# Patient Record
Sex: Male | Born: 1935 | Race: White | Hispanic: No | Marital: Married | Smoking: Former smoker
Health system: Southern US, Community
[De-identification: ages and names within clinical notes are randomized; demographics above are authoritative.]

## PROBLEM LIST (undated history)

## (undated) DIAGNOSIS — Z8679 Personal history of other diseases of the circulatory system: Secondary | ICD-10-CM

## (undated) DIAGNOSIS — R296 Repeated falls: Secondary | ICD-10-CM

## (undated) DIAGNOSIS — E782 Mixed hyperlipidemia: Secondary | ICD-10-CM

## (undated) DIAGNOSIS — C61 Malignant neoplasm of prostate: Secondary | ICD-10-CM

## (undated) DIAGNOSIS — I951 Orthostatic hypotension: Secondary | ICD-10-CM

## (undated) DIAGNOSIS — K219 Gastro-esophageal reflux disease without esophagitis: Secondary | ICD-10-CM

## (undated) DIAGNOSIS — I10 Essential (primary) hypertension: Secondary | ICD-10-CM

## (undated) HISTORY — PX: ABDOMINAL SURGERY: SHX537

## (undated) HISTORY — DX: Malignant neoplasm of prostate: C61

## (undated) HISTORY — DX: Repeated falls: R29.6

## (undated) HISTORY — PX: BACK SURGERY: SHX140

## (undated) HISTORY — DX: Personal history of other diseases of the circulatory system: Z86.79

## (undated) HISTORY — DX: Essential (primary) hypertension: I10

## (undated) HISTORY — PX: APPENDECTOMY: SHX54

---

## 1999-08-22 ENCOUNTER — Ambulatory Visit (HOSPITAL_COMMUNITY): Admission: RE | Admit: 1999-08-22 | Discharge: 1999-08-22 | Payer: Self-pay | Admitting: Gastroenterology

## 1999-08-22 ENCOUNTER — Encounter: Payer: Self-pay | Admitting: Gastroenterology

## 1999-10-26 ENCOUNTER — Ambulatory Visit (HOSPITAL_COMMUNITY): Admission: RE | Admit: 1999-10-26 | Discharge: 1999-10-26 | Payer: Self-pay | Admitting: Orthopedic Surgery

## 1999-10-26 ENCOUNTER — Encounter: Payer: Self-pay | Admitting: Orthopedic Surgery

## 1999-11-09 ENCOUNTER — Ambulatory Visit (HOSPITAL_COMMUNITY): Admission: RE | Admit: 1999-11-09 | Discharge: 1999-11-09 | Payer: Self-pay | Admitting: Orthopedic Surgery

## 1999-11-09 ENCOUNTER — Encounter: Payer: Self-pay | Admitting: Orthopedic Surgery

## 1999-11-23 ENCOUNTER — Encounter: Payer: Self-pay | Admitting: Orthopedic Surgery

## 1999-11-23 ENCOUNTER — Ambulatory Visit (HOSPITAL_COMMUNITY): Admission: RE | Admit: 1999-11-23 | Discharge: 1999-11-23 | Payer: Self-pay | Admitting: Orthopedic Surgery

## 1999-12-21 ENCOUNTER — Inpatient Hospital Stay (HOSPITAL_COMMUNITY): Admission: RE | Admit: 1999-12-21 | Discharge: 1999-12-22 | Payer: Self-pay | Admitting: Orthopedic Surgery

## 1999-12-21 ENCOUNTER — Encounter (INDEPENDENT_AMBULATORY_CARE_PROVIDER_SITE_OTHER): Payer: Self-pay | Admitting: Specialist

## 1999-12-21 ENCOUNTER — Encounter: Payer: Self-pay | Admitting: Orthopedic Surgery

## 2000-11-03 ENCOUNTER — Encounter: Payer: Self-pay | Admitting: *Deleted

## 2000-11-03 ENCOUNTER — Ambulatory Visit (HOSPITAL_COMMUNITY): Admission: RE | Admit: 2000-11-03 | Discharge: 2000-11-03 | Payer: Self-pay | Admitting: *Deleted

## 2002-06-24 ENCOUNTER — Encounter (INDEPENDENT_AMBULATORY_CARE_PROVIDER_SITE_OTHER): Payer: Self-pay

## 2002-06-24 ENCOUNTER — Ambulatory Visit (HOSPITAL_BASED_OUTPATIENT_CLINIC_OR_DEPARTMENT_OTHER): Admission: RE | Admit: 2002-06-24 | Discharge: 2002-06-24 | Payer: Self-pay | Admitting: Urology

## 2002-07-09 ENCOUNTER — Ambulatory Visit: Admission: RE | Admit: 2002-07-09 | Discharge: 2002-08-01 | Payer: Self-pay | Admitting: Radiation Oncology

## 2007-02-01 ENCOUNTER — Emergency Department (HOSPITAL_COMMUNITY): Admission: EM | Admit: 2007-02-01 | Discharge: 2007-02-01 | Payer: Self-pay | Admitting: Emergency Medicine

## 2008-02-05 ENCOUNTER — Inpatient Hospital Stay (HOSPITAL_COMMUNITY): Admission: EM | Admit: 2008-02-05 | Discharge: 2008-02-09 | Payer: Self-pay | Admitting: Emergency Medicine

## 2008-03-20 ENCOUNTER — Encounter: Admission: RE | Admit: 2008-03-20 | Discharge: 2008-04-16 | Payer: Self-pay | Admitting: Neurology

## 2008-04-20 ENCOUNTER — Encounter: Admission: RE | Admit: 2008-04-20 | Discharge: 2008-05-13 | Payer: Self-pay | Admitting: Neurology

## 2010-02-02 ENCOUNTER — Emergency Department (HOSPITAL_COMMUNITY)
Admission: EM | Admit: 2010-02-02 | Discharge: 2010-02-02 | Payer: Self-pay | Source: Home / Self Care | Admitting: Emergency Medicine

## 2010-06-29 LAB — BASIC METABOLIC PANEL
BUN: 22 mg/dL (ref 6–23)
CO2: 23 mEq/L (ref 19–32)
Calcium: 8.8 mg/dL (ref 8.4–10.5)
Chloride: 109 mEq/L (ref 96–112)
Creatinine, Ser: 0.79 mg/dL (ref 0.4–1.5)
GFR calc Af Amer: >60 mL/min (ref >60)
GFR calc non Af Amer: >60 mL/min (ref >60)
Glucose, Bld: 115 mg/dL — ABNORMAL HIGH (ref 70–99)
Potassium: 3.9 mEq/L (ref 3.5–5.1)
Sodium: 138 mEq/L (ref 135–145)

## 2010-06-29 LAB — DIFFERENTIAL
Basophils Absolute: 0 10*3/uL (ref 0.0–0.1)
Basophils Relative: 1 % (ref 0–1)
Eosinophils Absolute: 0.1 10*3/uL (ref 0.0–0.7)
Eosinophils Relative: 2 % (ref 0–5)
Lymphocytes Relative: 14 % (ref 12–46)
Lymphs Abs: 1 10*3/uL (ref 0.7–4.0)
Monocytes Absolute: 0.5 10*3/uL (ref 0.1–1.0)
Monocytes Relative: 6 % (ref 3–12)
Neutro Abs: 6.1 10*3/uL (ref 1.7–7.7)
Neutrophils Relative %: 78 % — ABNORMAL HIGH (ref 43–77)

## 2010-06-29 LAB — CBC
HCT: 37.6 % — ABNORMAL LOW (ref 39.0–52.0)
Hemoglobin: 12.8 g/dL — ABNORMAL LOW (ref 13.0–17.0)
MCH: 30.5 pg (ref 26.0–34.0)
MCHC: 34.1 g/dL (ref 30.0–36.0)
MCV: 89.3 fL (ref 78.0–100.0)
Platelets: 146 10*3/uL — ABNORMAL LOW (ref 150–400)
RBC: 4.21 MIL/uL — ABNORMAL LOW (ref 4.22–5.81)
RDW: 13.9 % (ref 11.5–15.5)
WBC: 7.8 10*3/uL (ref 4.0–10.5)

## 2010-08-30 NOTE — Discharge Summary (Signed)
NAME:  Jermaine Lee, Jermaine Lee NO.:  [id number]   MEDICAL RECORD NO.:  [id number]          PATIENT TYPE:  INP   LOCATION:  3004                         FACILITY:  [facility]   PHYSICIAN:  Marlan Palau, M.D.  DATE OF BIRTH:  13-May-1921   DATE OF ADMISSION:  02/05/2008  DATE OF DISCHARGE:  02/09/2008                               DISCHARGE SUMMARY   ADMISSION DIAGNOSIS:  1. History of gait instability with fall and confusion.  2. Parkinson disease.  3. Hypertension.   DISCHARGE DIAGNOSIS:  1. Parkinson disease.  2. Hypertension.  3. Confusion/encephalopathy.   PROCEDURES DURING THIS ADMISSION:  1. MRI of the brain.  2. MR angiogram of the intracranial vessels.   COMPLICATIONS TO ABOVE PROCEDURES:  None.   HISTORY OF PRESENT ILLNESS:  Jermaine Lee is an 77 year old right-handed  white male born on 1921-06-19 with a history of Parkinson  disease.  This patient has had recent fall prior to this admission with  some increasing confusion.  The patient has had some increased confusion  over the last several weeks with poor memory disorientation.  The  patient has been getting up and low at night, getting dressed as if to  go to work or to physical therapy.  The patient again has had several  falls.  The patient has had some confusion with taking his medications  as well and has had some problems with hallucinations prior to  admission.  The patient was brought into the hospital for further  management.   PAST MEDICAL HISTORY:  1. History of Parkinson disease.  2. Hypertension.  3. Lumbosacral spine surgery.  4. Cataract surgery.  5. Confusion and gait instability as above.   MEDICATIONS PRIOR TO ADMISSION:  1. Finasteride 5 mg daily.  2. Lotrel 5 mg daily.  3. Bromocriptine 5 mg 3 times daily.  4. Selegiline 5 mg daily.  5. Sinemet 25/100 three times daily.  6. Ethylene glycol if needed.   ALLERGIES:  The patient has history of allergies to SULFA  drugs.   HABITS:  Does not smoke or drink.   Please refer to history and physical dictation summary for social  history, family history, review of systems, and physical examination.   LABORATORY VALUES:  White count 4.6, hemoglobin 12.5, hematocrit 37.4,  MCV of 89.9, and platelets of 138.  INR of 1.1.  Sodium 138, potassium  3.9, chloride 109, CO2 23, glucose 109, BUN of 27, and creatinine 0.95.  TSH 1.7.  B12 level of 325.  Thiamine level of less than 7.  The patient  has a specific gravity of 1.025, pH of 5.5, and RPR nonreactive.   HOSPITAL COURSE:  This patient was admitted to [facility].  The  patient was maintained on his Parkinson's medications and was seen by  Physical and Occupational Therapy.  The patient did undergo an MRI of  the brain and had some motion artifact with this.  There was mild  subarachnoid hemorrhage in the right occipital area with a small  subdural hematoma and no acute stroke seen.  MRI of  the brain shows  motion-graded study.  There was some atherosclerosis in the distal right  vertebral artery and posterior cerebral artery and some atherosclerosis  in the anterior and middle cerebral arteries on both sides.  The patient  had an EKG with normal sinus rhythm, heart rate of 67.  The patient was  also seen by Dr. Bertram Savin.  Due to the recent fall and laceration,  the head was sutured.  The patient's mental status seemed to improve  significantly during hospitalization and decision was to send the  patient home.  The patient will follow up with Dr. Sandria Manly in 2-3 weeks and  follow with Dr. Jacky Kindle for suture removal.   DISCHARGE MEDICATIONS:  1. Finasteride 5 mg daily.  2. Lotrel 5 mg daily.  3. Bromocriptine 5 mg 3 times daily.  4. Selegiline 5 mg in the morning and one at lunchtime.  5. Sinemet 25/100 one 3 times daily.  6. Ethylene glycol if needed.  7. Amlodipine/benazepril 520 one daily.   FOLLOWUP:  The patient will again followup with  Dr. Jacky Kindle and Dr. Sandria Manly  as above.      Marlan Palau, M.D.  Electronically Signed     CKW/MEDQ  D:  05/11/2008  T:  05/11/2008  Job:  [id number]   cc:   [facility]

## 2010-08-30 NOTE — Procedures (Signed)
CLINICAL HISTORY:  The patient is an 77 year old who fell, lacerating  the left temporal region causing swelling to his eye.  The patient had  diffuse subarachnoid hemorrhage and a right parietooccipital small  subdural hematoma.  No intraparenchymal damage was seen.  (852.22,  852.02)   PROCEDURE:  The tracing was carried out on a 32-channel digital Cadwell  recorder reformatted into 16-channel montages with one devoted to EKG.  The patient was awake and drowsy during the recording.  The  international 10/20 system lead placement was used.  Medications include  Proscar, Seroquel, Sinemet, Parlodel, Eldepryl, Tylenol, and Ambien.  The patient has been unsteady, confused, dizzy, has had altered speech,  hallucinations prior to the fall.   DESCRIPTION OF FINDINGS:  Dominant frequency of 7 Hz, 20 microvolt  activity that is broadly distributed, this was present throughout most  of the record.  The patient does not change state of arousal.  There was  no focal slowing.  There was no interictal epileptiform activity form of  spikes or sharp waves.   EKG showed a regular sinus rhythm with ventricular response of 66 beats  per minute.   IMPRESSION:  Abnormal EEG on the basis of mild diffuse background  slowing.  This is a nonspecific indicator of neuronal dysfunction, it  maybe on a primary degenerative basis or secondary to a variety of toxic  or metabolic etiologies.      Deanna Artis. Sharene Skeans, M.D.  Electronically Signed     EAV:WUJW  D:  02/07/2008 00:05:39  T:  02/07/2008 05:01:51  Job #:  [id number]   cc:   Marlan Palau, M.D.  Fax: [phone]

## 2010-08-30 NOTE — H&P (Signed)
NAME:  Jermaine Lee, Jermaine Lee NO.:  [id number]   MEDICAL RECORD NO.:  [id number]          PATIENT TYPE:  EMS   LOCATION:  MAJO                         FACILITY:  [facility]   PHYSICIAN:  Marlan Palau, M.D.  DATE OF BIRTH:  11-09-1921   DATE OF ADMISSION:  02/05/2008  DATE OF DISCHARGE:                              HISTORY & PHYSICAL   CHIEF COMPLAINT:  Fall and confusion.   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  The patient was brought to [facility] ER on this  date secondary to a fall and increased confusion.  Speaking with the  wife, she states that the patient over the last few weeks has had an  increased number of falls, increased confusion, and lack of memory along  with disorientation.  She notes that today upon waking the patient had  put on 2 different shoes, put on slacks when he was to put on sweat  pants to go to physical therapy.  In addition, she has noted that  patient has gotten up in the past week to go the bathroom.  After  returning from the bathroom, got confused as to where he was and fell  next to the bed while trying to get into the bed.  She has also noted  that [place] this [place], the patient was  stepping backwards to sit down.  He misjudged the distance, lost his  balance and fell down.  Today the wife states that the patient woke up  confused, and as they got to the breakfast table his usual habit is to  take his medications for the day; however, she does not think he took  his medications today.  The patient was in full control of his  medications up to this date; however, it was explained to the wife that  at this point in time the best course of action is for her to start  separating the medications so as he does not get  confused or take too  many or too less.  While talking with his wife, it was noted that he has  had increased visual hallucinations, however, no auditory  hallucinations.  The patient is aware of these hallucinations and knows  they are not real.  These hallucinations have been going on for quite  some time but increased in the past couple of weeks.  Today patient was  noted by neighbor to drive into the driveway, get out of his car, and  walk inside; however, he left his flashing lights on.  His neighbor  walked over to explain to him that his lights were on and states that  when he had come to the door he looked very confused as though he had  gotten up.  She asked if he had just gotten up from sleep.  He stated  no.  In addition, she noted that  his gait was extremely shuffled and  stacatic.  The neighbor asked him to please get his keys to his car.  As  he turned around and she went to the car she heard a large thud and  noted him on the ground in the foyer of his house.  The patient did lose  consciousness for about 10 minutes and EMS was called.  Upon awakening  the patient was mumbling but no fluid speech.  His fall did cause, as  stated, a loss of consciousness and a large 3-cm horizontal laceration  to the parietal aspect of his head.  During interview the patient is  alert, however, he is not fully oriented.  At this time it cannot be  told for sure whether or not he has taken his full medications, or has  been taking the full dose and correct dose of medications.  It is  certain, according to the wife, that he has had increased number of  falls, decreased memory, increased confusion, and more hallucinations.   PAST MEDICAL HISTORY:  Significant for Parkinson's disease and  hypertension.   MEDICATIONS:  1. Finasteride 5 mg daily.  2. Lotrel 5 mg daily.  3. Bromocriptine 5 mg t.i.d.  4. Selegiline 5 mg q.a.m.  5. Carbidopa-Levodopa  25/100 t.i.d.  6. Ethylene glycol as  needed.   ALLERGIES:  SULFA?   FAMILY HISTORY:  Father is deceased, mother deceased at 70 years old  secondary to inhalation of chemicals.  Brother has prostate cancer.  Sister is healthy.  He is married and has 3 children which are all  normal.   SOCIAL HISTORY:  He does not smoke, drink or do illicit drugs.   REVIEW OF SYSTEMS:  All 12 review of systems were thoroughly reviewed  and negative with the exception of above.   PHYSICAL EXAMINATION:  VITAL SIGNS:  Blood pressure is 153/17, pulse 68,  respiratory rate 20, temperature is 97.2.  MENTAL STATUS:  He is alert, however, he does not know place, year.  He  had difficulty spelling world backwards.  He had difficulty with serial  7s.  CRANIAL NERVES:  Pupils are round, reactive to light and accommodating  conjugative gaze.  External ocular muscles are intact.  Visual field  intact.  It should be noted that the left pupil is 2 mm and slightly  elevated compared to the right pupil which is 3 mm.  Both, as stated,  are reactive to light.  Face is symmetrical.  Tongue midline.  Uvula  midline.  No dysarthria, aphasia, slurred speech.  Sensation in V1  through V3 are equal, shoulder shrug, hip turn are equal bilaterally.  Finger to nose is dysmetric with pursuit, however, he has full range of  motion.  Heel to shin, the patient was unable to do this secondary to  weakness.  His fine motor movement was slow.  Motor, he has 5/5 strength  in all extremities.  He does have a resting tremor with his hands that  is intermittent, but noticeable with hands held out and wrists extended.  He has no atrophy, he has good tone, but he does have cogwheeling,  rigidity with flexion extension of his biceps/triceps and passive  flexion extension of his legs.  Deep tendon reflexes are 2+ in upper  extremities, 3+ at the patella, 1+ at the Achilles bilaterally, and  bilateral downgoing toes.  Sensation is decreased over his right lower  extremity  starting just below the knee and extending to  the foot  compared to the left.  He also states that he cannot feel vibration at  his ankle.  Will have to review other physical exams to see if this is  new.  Otherwise, globally intact.   LABORATORY DATA:  Urinalysis pending.  Calcium is 8.8, sodium is 138,  potassium is 3.9, chloride is 109, CO2 is 23, BUN is 27, creatinine is  0.95, glucose is 109, white blood cells are 4.6, platelets 138,  hemoglobin and hematocrit is 12.5 and 37.4.   IMAGING AND TESTS:  CT of cervical spine shows moderate degenerative  disk disease but good alignment.   Chest x-ray shows no active disease or atelectasis.   MRI is pending and EEG is pending at this time.   ASSESSMENT:  This is an 77 year old male with known Parkinson's disease  and increasing dementia status post fall today.  History of increased  confusion, falling, and hallucinations over the past couple of weeks.  It is unknown if patient did take his regular doses of medications  today, and if he has taken the correct doses over the last few weeks as  wife states that he is in control of his medications; however, he has  been confused lately.   TREATMENT AND PLAN:  1. MRI of the head.  2. EEG.  3. Admit and monitor with correct medication doses, may need to alter      doses.  4. It has been discussed with the patient's wife that at this point in      time it would be beneficial for her to take control of the      medications so he does not take the wrong dose or forget a dose.     ______________________________  Felicie Morn, PA-C      C. Lesia Sago, M.D.  Electronically Signed    DS/MEDQ  D:  02/05/2008  T:  02/05/2008  Job:  [id number]

## 2010-08-30 NOTE — Consult Note (Signed)
NAME:  Jermaine Lee, Jermaine Lee NO.:  [id number]   MEDICAL RECORD NO.:  [id number]          PATIENT TYPE:  EMS   LOCATION:  MAJO                         FACILITY:  [facility]   PHYSICIAN:  Lennie Muckle, MD      DATE OF BIRTH:  1921-12-27   DATE OF CONSULTATION:  DATE OF DISCHARGE:                                 CONSULTATION   PRIMARY CARE PHYSICIAN:  Geoffry Paradise, M.D.   REASON FOR CONSULT:  Fall with confusion.   HISTORY OF PRESENT ILLNESS:  [name] is an 77 year old male with known  Parkinson disease who has witnessed a fall from standing height as  witnessed by neighbor and by his wife.  There was a question of loss of  consciousness with less than an hour.  The story was that the neighbor  had noted the patient's blinkers on his car, came to tell the patient,  when he went in to retrieve his keys, he fell.  The wife noted he had a  recent history of confusion, dizziness, and hallucinations at home.  He  had also had some altered speech and slurring of his speech prior to the  event.  He currently is awake and has a GCS of 13.  He does answer  questions appropriately and does not currently seem confused.   PAST MEDICAL HISTORY:  Remarkable for Parkinson disease.   SURGICAL HISTORY:  Had previous back surgery as well as cataract  surgery.   SOCIAL HISTORY:  No tobacco or alcohol use.  Married and lives with his  wife.  He is retired.   ALLERGIES:  PENICILLIN.   MEDICATIONS:  Multiple and unknown at the present time. We are currently  trying to achieve this list.   REVIEW OF SYSTEMS:  Normal except for the above as in the HPI with  mental status changes, confusion, and somewhat has a sluggish gait   PHYSICAL EXAMINATION:  VITAL SIGNS:  Temperature 97.2, pulse 68, BP  153/70, and O2 sat on room air 97%.  GENERAL:  He appears his stated age.  He is lying in his stretcher, does  open his eyes on command.  SKIN:  He has, on examination of his skin, multiple  abrasions to the  left arm.  He has a laceration noted, semicircle on the left temporal  region, noted to be approximately 5 cm in size, not bleeding.  HEENT:  Head is normocephalic.  There is some mild swelling around the  left frontal area with abrasions.  Eyes are equal and round.  Ears are  clear.  Midface is stable. No injuries are noted.  NECK:  Without tenderness.  PULMONARY:  Clear to auscultation bilaterally.  CARDIOVASCULAR:  Regular rate and rhythm.  ABDOMEN:  Soft, nontender, and nondistended.  PELVIS:  Stable.  MUSCULOSKELETAL:  No significant deformities.  There are multiple  abrasions on the left upper extremity.  BACK:  He has a scar on his lumbar region from previous surgery.  NEUROLOGICAL:  He does have a bilateral upper extremity pill-rolling  tremor.  The cranial nerves II through XII are  grossly intact.  They are  symmetric on examination of the upper and lower extremity.   LABORATORY DATA:  Slightly low with hemoglobin of 12, otherwise  essentially normal.  CT scan of the head and neck are negative for acute  findings.  C-spine is clear.   ASSESSMENT AND PLAN:  Status post fall with a laceration to the left  temporal region that should be closed by the Emergency Department.  He  does have a mental status changes prior to his fall.  It could be  potentially related to his Parkinson's or his medication.  He will be  admitted to the Neurology Service.  Trauma has been consulted.  We will  follow for the next 24 hours, but likely he would not require any  surgery or other intervention.      Lennie Muckle, MD  Electronically Signed     ALA/MEDQ  D:  02/05/2008  T:  02/06/2008  Job:  [id number]   cc:   Gabrielle Dare. Janee Morn, M.D.

## 2010-09-02 NOTE — Op Note (Signed)
NAME:  Jermaine Lee, Jermaine Lee                          ACCOUNT NO.:  [id number]   MEDICAL RECORD NO.:  [id number]                   PATIENT TYPE:  AMB   LOCATION:  NESC                                 [facility]ACILITY[facility]   PHYSICIAN:  Ronald L. Ovidio Hanger, M.D.           DATE OF BIRTH:  [birth year]   DATE OF PROCEDURE:  06/24/2002  DATE OF DISCHARGE:                                 OPERATIVE REPORT   PREOPERATIVE DIAGNOSES:  Gross hematuria, prostatic bleeding, liver cyst.   POSTOPERATIVE DIAGNOSES:  Gross hematuria, prostatic bleeding, liver cyst.   OPERATION:  Cystourethroscopy, hot loop biopsy prostatic urethra.  Transrectal ultrasound and biopsy of the prostate and liver ultrasound.   SURGEON:  Lucrezia Starch. Earlene Plater, M.D.   ANESTHESIA:  General laryngeal airway.   ESTIMATED BLOOD LOSS:  15 mL.   TUBES:  22 French 10 mL balloon catheter.   COMPLICATIONS:  None.   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 He subsequently underwent a workup which  consisted of a CT scan of the abdomen and pelvis with and without contrast  and there was subcentimeter cyst in the kidney, two small probable cystic  lesions of the liver which felt needed to be followed up with an ultrasound  and on cystourethroscopy he had significant prostate bleeding. We considered  all options carefully, his cultures have been negative and it was felt that  prostate biopsy both Cobb loop and needle biopsies were indicated. He  understands the risks, benefits, and alternatives and has elected to  proceed. He has also been begun on Proscar 5 mg p.o. daily and he has been  diagnosed with a melanoma on his back in the interim.   DESCRIPTION OF PROCEDURE:  The patient was placed in supine position and  after proper general laryngeal airway anesthesia was placed in the dorsal  lithotomy position, prepped and draped with Betadine in the sterile fashion.  The  urethral meatus was somewhat tight and was calibrated to 22 [place] and a  24 French Olympus resectoscope sheath was placed over a Timberlake obturator  and utilizing the camera and the hot loop, the bladder was visualized. With  the 12 and 70 degree lenses, the bladder was carefully inspected and was  noted to have grade 1 trabeculation. Efflux of clear urine was noted from  the normally placed ureteral orifices bilaterally. There was quite an  enlarged prostate with a large median bar and significant bleeding from the  mid prostatic urethra. Utilizing the hot loop and 12 degree lens, hot loop  biopsies were obtained. The area of the bladder neck extended to the mid  prostate in the 6 o'clock position and submitted to pathology. The base was  cauterized with Bugbee coagulation cautery. The resectoscope sheath was  visually removed and a 22 French 10 mL ballon Foley was passed and the urine  was noted to drain clear. Next, the Seaman's transrectal ultrasound probe  was placed and localized. Transrectal ultrasound was then performed with the  6 MHz probe, both axial and sagittal scans were performed. The prostate was  noted to be 116.7 mL utilizing volumetric measurement. There was significant  transitional cell hypertrophy, there were a few central __________  calcifications, the capsule was intact at the seminal vesicle and seminal  vesicle prostatic angles and there was slight heteroechogenicity but no hypo  or hyperechoic nodules were noted. Utilizing the biopsy probe, biopsies were  obtained, apex mid base laterally, both right and left sides and transition  zones with 4 biopsies in each side submitted as right and left sides. Good  hemostasis was noted to be present, the probe was removed. Attempt at liver  ultrasound was performed and although a definite cyst in the right lobe of  the liver, it was measured at 13.9 x 11.6 mm noted to be present. The left  lobe cystic lesions could not  be well visualized and needed a high powered  machine with better resolution. He tolerated the procedure well and was  taken to the recovery room stable.   PLAN:  1. Have him followup today in the office for a color flow Doppler of the     liver to make sure the lesions are not problematic especially in lieu of     having recently diagnosed melanoma.  2. He will maintain his catheter overnight, if the urine is clear he will     remove it in the morning. He will call me as needed and he will call me     on Friday for his biopsy results.                                               Ronald L. Ovidio Hanger, M.D.    RLD/MEDQ  D:  06/24/2002  T:  06/24/2002  Job:  [id number]

## 2011-01-17 LAB — DIFFERENTIAL
Basophils Absolute: 0
Basophils Relative: 1
Eosinophils Absolute: 0
Eosinophils Relative: 1
Lymphocytes Relative: 24
Lymphs Abs: 1.1
Monocytes Absolute: 0.4
Monocytes Relative: 8
Neutro Abs: 3
Neutrophils Relative %: 66

## 2011-01-17 LAB — BASIC METABOLIC PANEL
BUN: 18
BUN: 27 — ABNORMAL HIGH
CO2: 23
CO2: 23
Calcium: 8.8
Calcium: 8.8
Chloride: 109
Chloride: 110
Creatinine, Ser: 0.78
Creatinine, Ser: 0.95
GFR calc Af Amer: >60
GFR calc Af Amer: >60
GFR calc non Af Amer: >60
GFR calc non Af Amer: >60
Glucose, Bld: 109 — ABNORMAL HIGH
Glucose, Bld: 98
Potassium: 3.9
Potassium: 3.9
Sodium: 138
Sodium: 140

## 2011-01-17 LAB — CBC
HCT: 37.4 — ABNORMAL LOW
Hemoglobin: 12.5 — ABNORMAL LOW
MCHC: 33.4
MCV: 89.9
Platelets: 138 — ABNORMAL LOW
RBC: 4.16 — ABNORMAL LOW
RDW: 13.3
WBC: 4.6

## 2011-01-17 LAB — PROTIME-INR
INR: 1.1
Prothrombin Time: 14.3

## 2011-01-17 LAB — URINALYSIS, ROUTINE W REFLEX MICROSCOPIC
Bilirubin Urine: NEGATIVE
Glucose, UA: NEGATIVE
Hgb urine dipstick: NEGATIVE
Ketones, ur: 40 — AB
Nitrite: NEGATIVE
Protein, ur: NEGATIVE
Specific Gravity, Urine: 1.025
Urobilinogen, UA: 1
pH: 5.5

## 2011-01-17 LAB — URINE CULTURE
Colony Count: NO GROWTH
Culture: NO GROWTH

## 2011-01-17 LAB — CULTURE, BLOOD (ROUTINE X 2): Culture: NO GROWTH

## 2011-01-17 LAB — VITAMIN B1: Vitamin B1 (Thiamine): <7 nmol/L — ABNORMAL LOW (ref 9–44)

## 2011-01-17 LAB — TSH: TSH: 1.766

## 2011-01-17 LAB — VITAMIN B12: Vitamin B-12: 325 (ref 211–911)

## 2011-01-17 LAB — RPR: RPR Ser Ql: NONREACTIVE

## 2011-03-28 ENCOUNTER — Ambulatory Visit: Payer: Medicare Other

## 2011-03-28 ENCOUNTER — Ambulatory Visit: Payer: Medicare Other | Attending: Neurology | Admitting: Physical Therapy

## 2011-03-28 DIAGNOSIS — R279 Unspecified lack of coordination: Secondary | ICD-10-CM | POA: Insufficient documentation

## 2011-03-28 DIAGNOSIS — Z5189 Encounter for other specified aftercare: Secondary | ICD-10-CM | POA: Insufficient documentation

## 2011-03-28 DIAGNOSIS — G20A1 Parkinson's disease without dyskinesia, without mention of fluctuations: Secondary | ICD-10-CM | POA: Insufficient documentation

## 2011-03-28 DIAGNOSIS — R471 Dysarthria and anarthria: Secondary | ICD-10-CM | POA: Insufficient documentation

## 2011-03-28 DIAGNOSIS — G20 Parkinson's disease: Secondary | ICD-10-CM | POA: Insufficient documentation

## 2011-03-29 ENCOUNTER — Ambulatory Visit: Payer: Medicare Other | Admitting: Physical Therapy

## 2011-03-30 ENCOUNTER — Other Ambulatory Visit: Payer: Self-pay | Admitting: Internal Medicine

## 2011-03-31 ENCOUNTER — Ambulatory Visit
Admission: RE | Admit: 2011-03-31 | Discharge: 2011-03-31 | Disposition: A | Discharge status: Home / Self Care | Payer: Medicare Other | Source: Ambulatory Visit | Attending: Internal Medicine | Admitting: Internal Medicine

## 2011-03-31 MED ORDER — IOHEXOL 300 MG/ML  SOLN
100.0000 mL | Freq: Once | INTRAMUSCULAR | Status: AC | PRN
  Administered 2011-03-31: 100 mL via INTRAVENOUS

## 2011-04-04 ENCOUNTER — Ambulatory Visit: Payer: Medicare Other | Admitting: Physical Therapy

## 2011-04-13 ENCOUNTER — Ambulatory Visit: Payer: Medicare Other

## 2011-04-25 ENCOUNTER — Ambulatory Visit: Payer: Medicare Other | Attending: Neurology | Admitting: Physical Therapy

## 2011-04-25 ENCOUNTER — Ambulatory Visit: Payer: Medicare Other

## 2011-04-25 DIAGNOSIS — IMO0001 Reserved for inherently not codable concepts without codable children: Secondary | ICD-10-CM | POA: Insufficient documentation

## 2011-04-25 DIAGNOSIS — G20A1 Parkinson's disease without dyskinesia, without mention of fluctuations: Secondary | ICD-10-CM | POA: Insufficient documentation

## 2011-04-25 DIAGNOSIS — R279 Unspecified lack of coordination: Secondary | ICD-10-CM | POA: Insufficient documentation

## 2011-04-25 DIAGNOSIS — G20 Parkinson's disease: Secondary | ICD-10-CM | POA: Insufficient documentation

## 2011-04-25 DIAGNOSIS — R471 Dysarthria and anarthria: Secondary | ICD-10-CM | POA: Insufficient documentation

## 2011-04-27 ENCOUNTER — Ambulatory Visit: Payer: Medicare Other | Admitting: Physical Therapy

## 2011-04-27 ENCOUNTER — Ambulatory Visit: Payer: Medicare Other

## 2011-04-28 ENCOUNTER — Ambulatory Visit (INDEPENDENT_AMBULATORY_CARE_PROVIDER_SITE_OTHER): Payer: Medicare Other

## 2011-04-28 DIAGNOSIS — I10 Essential (primary) hypertension: Secondary | ICD-10-CM

## 2011-05-02 ENCOUNTER — Ambulatory Visit: Payer: Medicare Other

## 2011-05-02 ENCOUNTER — Ambulatory Visit: Payer: Medicare Other | Admitting: Physical Therapy

## 2011-05-04 ENCOUNTER — Ambulatory Visit: Payer: Medicare Other | Admitting: Physical Therapy

## 2011-05-04 ENCOUNTER — Ambulatory Visit: Payer: Medicare Other

## 2011-05-09 ENCOUNTER — Ambulatory Visit: Payer: Medicare Other

## 2011-05-09 ENCOUNTER — Ambulatory Visit: Payer: Medicare Other | Admitting: Physical Therapy

## 2011-05-12 ENCOUNTER — Ambulatory Visit: Payer: Medicare Other

## 2011-05-12 ENCOUNTER — Ambulatory Visit: Payer: Medicare Other | Admitting: Physical Therapy

## 2011-05-23 ENCOUNTER — Ambulatory Visit: Payer: Medicare Other

## 2011-05-23 ENCOUNTER — Ambulatory Visit: Payer: Medicare Other | Attending: Neurology | Admitting: Physical Therapy

## 2011-05-23 DIAGNOSIS — G20 Parkinson's disease: Secondary | ICD-10-CM | POA: Insufficient documentation

## 2011-05-23 DIAGNOSIS — G20A1 Parkinson's disease without dyskinesia, without mention of fluctuations: Secondary | ICD-10-CM | POA: Insufficient documentation

## 2011-05-23 DIAGNOSIS — R471 Dysarthria and anarthria: Secondary | ICD-10-CM | POA: Insufficient documentation

## 2011-05-23 DIAGNOSIS — Z5189 Encounter for other specified aftercare: Secondary | ICD-10-CM | POA: Insufficient documentation

## 2011-05-23 DIAGNOSIS — R279 Unspecified lack of coordination: Secondary | ICD-10-CM | POA: Insufficient documentation

## 2011-05-25 ENCOUNTER — Ambulatory Visit: Payer: Medicare Other | Admitting: Physical Therapy

## 2011-05-30 ENCOUNTER — Ambulatory Visit: Payer: Medicare Other | Admitting: Physical Therapy

## 2011-05-31 ENCOUNTER — Ambulatory Visit: Payer: Medicare Other | Admitting: Physical Therapy

## 2011-05-31 ENCOUNTER — Ambulatory Visit: Payer: Medicare Other

## 2011-06-01 ENCOUNTER — Ambulatory Visit: Payer: Medicare Other | Admitting: Physical Therapy

## 2011-06-06 ENCOUNTER — Ambulatory Visit: Payer: Medicare Other | Admitting: Physical Therapy

## 2011-06-08 ENCOUNTER — Ambulatory Visit: Payer: Medicare Other | Admitting: Physical Therapy

## 2011-06-13 ENCOUNTER — Ambulatory Visit: Payer: Medicare Other | Admitting: Physical Therapy

## 2011-06-14 ENCOUNTER — Ambulatory Visit: Payer: Medicare Other | Admitting: Physical Therapy

## 2011-06-15 ENCOUNTER — Ambulatory Visit: Payer: Medicare Other | Admitting: Physical Therapy

## 2011-06-16 ENCOUNTER — Ambulatory Visit: Payer: Medicare Other | Admitting: Physical Therapy

## 2011-07-04 ENCOUNTER — Other Ambulatory Visit: Payer: Self-pay | Admitting: Dermatology

## 2011-08-29 ENCOUNTER — Emergency Department (HOSPITAL_COMMUNITY): Payer: Medicare Other

## 2011-08-29 ENCOUNTER — Emergency Department (HOSPITAL_COMMUNITY)
Admission: EM | Admit: 2011-08-29 | Discharge: 2011-08-29 | Disposition: A | Discharge status: Home / Self Care | Payer: Medicare Other | Attending: Emergency Medicine | Admitting: Emergency Medicine

## 2011-08-29 ENCOUNTER — Encounter (HOSPITAL_COMMUNITY): Payer: Self-pay | Admitting: Emergency Medicine

## 2011-08-29 DIAGNOSIS — S0003XA Contusion of scalp, initial encounter: Secondary | ICD-10-CM | POA: Insufficient documentation

## 2011-08-29 DIAGNOSIS — S40019A Contusion of unspecified shoulder, initial encounter: Secondary | ICD-10-CM | POA: Insufficient documentation

## 2011-08-29 DIAGNOSIS — M25569 Pain in unspecified knee: Secondary | ICD-10-CM | POA: Insufficient documentation

## 2011-08-29 DIAGNOSIS — M25529 Pain in unspecified elbow: Secondary | ICD-10-CM | POA: Insufficient documentation

## 2011-08-29 DIAGNOSIS — S6990XA Unspecified injury of unspecified wrist, hand and finger(s), initial encounter: Secondary | ICD-10-CM | POA: Insufficient documentation

## 2011-08-29 DIAGNOSIS — S4980XA Other specified injuries of shoulder and upper arm, unspecified arm, initial encounter: Secondary | ICD-10-CM | POA: Insufficient documentation

## 2011-08-29 DIAGNOSIS — S51009A Unspecified open wound of unspecified elbow, initial encounter: Secondary | ICD-10-CM | POA: Insufficient documentation

## 2011-08-29 DIAGNOSIS — S59909A Unspecified injury of unspecified elbow, initial encounter: Secondary | ICD-10-CM | POA: Insufficient documentation

## 2011-08-29 DIAGNOSIS — W1809XA Striking against other object with subsequent fall, initial encounter: Secondary | ICD-10-CM | POA: Insufficient documentation

## 2011-08-29 DIAGNOSIS — M542 Cervicalgia: Secondary | ICD-10-CM | POA: Insufficient documentation

## 2011-08-29 DIAGNOSIS — G20 Parkinson's disease: Secondary | ICD-10-CM | POA: Insufficient documentation

## 2011-08-29 DIAGNOSIS — G20A1 Parkinson's disease without dyskinesia, without mention of fluctuations: Secondary | ICD-10-CM | POA: Insufficient documentation

## 2011-08-29 DIAGNOSIS — I10 Essential (primary) hypertension: Secondary | ICD-10-CM | POA: Insufficient documentation

## 2011-08-29 DIAGNOSIS — S0083XA Contusion of other part of head, initial encounter: Secondary | ICD-10-CM

## 2011-08-29 DIAGNOSIS — S51019A Laceration without foreign body of unspecified elbow, initial encounter: Secondary | ICD-10-CM

## 2011-08-29 DIAGNOSIS — W19XXXA Unspecified fall, initial encounter: Secondary | ICD-10-CM

## 2011-08-29 DIAGNOSIS — M25519 Pain in unspecified shoulder: Secondary | ICD-10-CM | POA: Insufficient documentation

## 2011-08-29 DIAGNOSIS — S46909A Unspecified injury of unspecified muscle, fascia and tendon at shoulder and upper arm level, unspecified arm, initial encounter: Secondary | ICD-10-CM | POA: Insufficient documentation

## 2011-08-29 DIAGNOSIS — Z79899 Other long term (current) drug therapy: Secondary | ICD-10-CM | POA: Insufficient documentation

## 2011-08-29 NOTE — Discharge Instructions (Signed)
Return here as needed, for any worsening in your condition.  Followup with her primary care Dr. for recheck.  Keep the area of the elbow covered with a bandage.

## 2011-08-29 NOTE — ED Notes (Signed)
EMS reports that patient fell coming out of bathroom. Patient complain to right side of neck, right shoulder and right elbow. Patient also has a skin tear to right elbow and an a abrasion to right side of forehead.

## 2011-08-29 NOTE — ED Notes (Signed)
Patient discharge with family with written and verbal instructions in a wheelchair. Respirations equal and unlabored, skin warm and Dry. No acute distress noted.

## 2011-08-29 NOTE — ED Provider Notes (Signed)
History     CSN: [id number]  Arrival date & time 08/29/11  1916   First MD Initiated Contact with Patient 08/29/11 2004      Chief Complaint  Patient presents with  . Fall  . Shoulder Injury  . Elbow Injury    (Consider location/radiation/quality/duration/timing/severity/associated sxs/prior treatment) Patient is a 78 y.o. male presenting with fall and shoulder injury.  Fall  Shoulder Injury   Patient presents to the emergency department following a fall just prior to arrival.  Patient states that he feels like it is deep to his Parkinson's and his feet got caught and he fell forward or to the right hitting against the wall and falling to the floor.  Patient denies loss of consciousness, headache, syncope, blurred vision, dizziness, chest pain, shortness of breath, back pain, hip pain, or abdominal pain.  Patient, states he does have some mild pain in his neck on the right, his left elbow and right shoulder. The patient states that his R knee is mildly tender as well. The patient states that nothing seems to make his pain any worse. The patient has a skin tear on the R elbow. Past Medical History  Diagnosis Date  . Parkinson's disease   . Hypertension     Past Surgical History  Procedure Date  . Abdominal surgery   . Back surgery   . Appendectomy     No family history on file.  History  Substance Use Topics  . Smoking status: Former Games developer  . Smokeless tobacco: Never Used  . Alcohol Use: 1.2 oz/week    1 Glasses of wine, 1 Cans of beer per week      Review of Systems All other systems negative except as documented in the HPI. All pertinent positives and negatives as reviewed in the HPI.  Allergies  Penicillins and Sulfa antibiotics  Home Medications   Current Outpatient Rx  Name Route Sig Dispense Refill  . BENAZEPRIL HCL 20 MG PO TABS Oral Take 20 mg by mouth daily.    Marland Kitchen CARBIDOPA-LEVODOPA 25-100 MG PO TABS Oral Take 0.5-1 tablets by mouth See admin  instructions. Take 1 tablet in the am, 1 at lunch and 0.5 tablet in the evening.    Marland Kitchen FINASTERIDE 5 MG PO TABS Oral Take 5 mg by mouth daily.      BP 195/62  Pulse 62  Temp(Src) 98.6 F (37 C) (Oral)  Resp 20  SpO2 98%  Physical Exam  Constitutional: He is oriented to person, place, and time. He appears well-developed and well-nourished. No distress.  HENT:  Head: Normocephalic and atraumatic.  Eyes: EOM are normal. Pupils are equal, round, and reactive to light.  Neck: Normal range of motion. Neck supple.  Cardiovascular: Normal rate, regular rhythm and normal heart sounds.   Pulmonary/Chest: Effort normal and breath sounds normal.  Musculoskeletal:       Right elbow: He exhibits normal range of motion, no swelling, no effusion and no deformity.       Thoracic back: He exhibits normal range of motion, no tenderness, no deformity and no pain.       Lumbar back: He exhibits no tenderness, no bony tenderness, no swelling and no deformity.       Back:       Arms:      Legs: Neurological: He is alert and oriented to person, place, and time. He has normal strength. No sensory deficit. GCS eye subscore is 4. GCS verbal subscore is 5. GCS motor  subscore is 6.  Skin: Skin is warm and dry.    ED Course  Procedures (including critical care time)  Labs Reviewed - No data to display Dg Shoulder Right  08/29/2011  *RADIOLOGY REPORT*  Clinical Data: Fall.  Shoulder injury and pain.  RIGHT SHOULDER - 2+ VIEW  Comparison: None.  Findings: No evidence of fracture or dislocation.  Moderate degenerative changes are seen involving the acromioclavicular joint. Mild spur formation is seen along the undersurface of the acromion.  Mild degenerative spurring of the glenohumeral joint also noted.  No other significant bone abnormality identified. Soft tissues are unremarkable.  IMPRESSION:  1.  No acute findings. 2.  Moderate acromioclavicular degenerative changes, and spurring of the undersurface of the  acromion. 3.  Mild glenohumeral degenerative spurring.  Original Report Authenticated By: Danae Orleans, M.D.   Dg Elbow Complete Right  08/29/2011  *RADIOLOGY REPORT*  Clinical Data: Right elbow pain and laceration after fall.  RIGHT ELBOW - COMPLETE 3+ VIEW  Comparison: None.  Findings: Mild degenerative changes in the right elbow.  Right elbow appears otherwise intact.  No evidence of acute fracture or subluxation.  No focal bone lesion or bone destruction.  No significant effusion.  Subcutaneous gas in the olecranon region likely represents laceration.  IMPRESSION: No acute bony abnormalities.  Original Report Authenticated By: Marlon Pel, M.D.   Ct Head Wo Contrast  08/29/2011  *RADIOLOGY REPORT*  Clinical Data:  Fall right neck and shoulder pain.  Right forehead injury.  CT HEAD WITHOUT CONTRAST CT CERVICAL SPINE WITHOUT CONTRAST  Technique:  Multidetector CT imaging of the head and cervical spine was performed following the standard protocol without intravenous contrast.  Multiplanar CT image reconstructions of the cervical spine were also generated.  Comparison:  02/02/2010  CT HEAD  Findings: The brain stem, cerebellum, cerebral peduncles, thalami, basal ganglia, basilar cisterns, and ventricular system appear unremarkable.  No intracranial hemorrhage, mass lesion, or acute infarction is identified.  Mild chronic right maxillary sinusitis is present.  IMPRESSION:  1.  Mild chronic right maxillary sinusitis.  No acute intracranial findings.  CT CERVICAL SPINE  Findings: Mildly exaggerated cervical lordosis noted.  There is facet fusion on the right at C7-T1 which appears chronic.  Uncinate spurring causes mild right foraminal stenosis at C6-7.  There is 1 mm degenerative anterior subluxation of C7 on T1.  No acute subluxation or fracture is observed.  IMPRESSION:  1.  No cervical spine fracture or acute subluxation. 2.  Mild right foraminal stenosis at C6-7 due to uncinate spurring. 3.  Mildly  exaggerated cervical lordosis. 4.  Chronic facet fusion on the right at C7-T1.  Original Report Authenticated By: Dellia Cloud, M.D.   Ct Cervical Spine Wo Contrast  08/29/2011  *RADIOLOGY REPORT*  Clinical Data:  Fall right neck and shoulder pain.  Right forehead injury.  CT HEAD WITHOUT CONTRAST CT CERVICAL SPINE WITHOUT CONTRAST  Technique:  Multidetector CT imaging of the head and cervical spine was performed following the standard protocol without intravenous contrast.  Multiplanar CT image reconstructions of the cervical spine were also generated.  Comparison:  02/02/2010  CT HEAD  Findings: The brain stem, cerebellum, cerebral peduncles, thalami, basal ganglia, basilar cisterns, and ventricular system appear unremarkable.  No intracranial hemorrhage, mass lesion, or acute infarction is identified.  Mild chronic right maxillary sinusitis is present.  IMPRESSION:  1.  Mild chronic right maxillary sinusitis.  No acute intracranial findings.  CT CERVICAL SPINE  Findings: Mildly  exaggerated cervical lordosis noted.  There is facet fusion on the right at C7-T1 which appears chronic.  Uncinate spurring causes mild right foraminal stenosis at C6-7.  There is 1 mm degenerative anterior subluxation of C7 on T1.  No acute subluxation or fracture is observed.  IMPRESSION:  1.  No cervical spine fracture or acute subluxation. 2.  Mild right foraminal stenosis at C6-7 due to uncinate spurring. 3.  Mildly exaggerated cervical lordosis. 4.  Chronic facet fusion on the right at C7-T1.  Original Report Authenticated By: Dellia Cloud, M.D.   Dg Knee Complete 4 Views Right  08/29/2011  *RADIOLOGY REPORT*  Clinical Data: Fall.  Right knee injury and pain.  RIGHT KNEE - COMPLETE 4+ VIEW  Comparison: None.  Findings: No evidence of fracture or dislocation.  No evidence of knee arthropathy or knee joint effusion.  No other significant bone abnormality identified.  Peripheral vascular calcification noted.   IMPRESSION: No acute findings.  Original Report Authenticated By: Danae Orleans, M.D.     Patient had a skin tear total right elbow that was tacked down with Dermabond.  No suturing was needed because of the thin skin of the skin tear and with his age the skin would not hold sutures.  Patient is advised to return here for any worsening in his condition or x-rays.  Results were given and all questions were answered for the family and the patient.    MDM  MDM Reviewed: vitals and nursing note Interpretation: x-ray and CT scan            Carlyle Dolly, PA-C 08/29/11 2225

## 2011-08-29 NOTE — ED Provider Notes (Signed)
Medical screening examination/treatment/procedure(s) were conducted as a shared visit with non-physician practitioner(s) and myself.  I personally evaluated the patient during the encounter On my exam the patient was in no distress, resting comfortably.  The patient's radiographic studies were reassuring, and the patient's right elbow skin tear was dressed appropriately.  He was discharged in stable condition with his family members.  Gerhard Munch, MD 08/29/11 [phone]

## 2011-10-11 ENCOUNTER — Encounter (HOSPITAL_COMMUNITY): Payer: Self-pay | Admitting: *Deleted

## 2011-10-11 ENCOUNTER — Emergency Department (HOSPITAL_COMMUNITY): Payer: Medicare Other

## 2011-10-11 ENCOUNTER — Emergency Department (HOSPITAL_COMMUNITY)
Admission: EM | Admit: 2011-10-11 | Discharge: 2011-10-11 | Disposition: A | Discharge status: Home / Self Care | Payer: Medicare Other | Attending: Emergency Medicine | Admitting: Emergency Medicine

## 2011-10-11 DIAGNOSIS — M25539 Pain in unspecified wrist: Secondary | ICD-10-CM | POA: Insufficient documentation

## 2011-10-11 DIAGNOSIS — G20A1 Parkinson's disease without dyskinesia, without mention of fluctuations: Secondary | ICD-10-CM | POA: Insufficient documentation

## 2011-10-11 DIAGNOSIS — G20 Parkinson's disease: Secondary | ICD-10-CM | POA: Insufficient documentation

## 2011-10-11 DIAGNOSIS — R079 Chest pain, unspecified: Secondary | ICD-10-CM | POA: Insufficient documentation

## 2011-10-11 DIAGNOSIS — S0100XA Unspecified open wound of scalp, initial encounter: Secondary | ICD-10-CM | POA: Insufficient documentation

## 2011-10-11 DIAGNOSIS — W010XXA Fall on same level from slipping, tripping and stumbling without subsequent striking against object, initial encounter: Secondary | ICD-10-CM | POA: Insufficient documentation

## 2011-10-11 DIAGNOSIS — I10 Essential (primary) hypertension: Secondary | ICD-10-CM | POA: Insufficient documentation

## 2011-10-11 DIAGNOSIS — S0101XA Laceration without foreign body of scalp, initial encounter: Secondary | ICD-10-CM

## 2011-10-11 MED ORDER — BACITRACIN ZINC 500 UNIT/GM EX OINT
TOPICAL_OINTMENT | CUTANEOUS | Status: AC
  Administered 2011-10-11: 10:00:00
  Filled 2011-10-11: qty 0.9

## 2011-10-11 MED ORDER — LIDOCAINE-EPINEPHRINE (PF) 1 %-1:200000 IJ SOLN
INTRAMUSCULAR | Status: AC
  Administered 2011-10-11: 10:00:00
  Filled 2011-10-11: qty 10

## 2011-10-11 NOTE — ED Provider Notes (Signed)
History     CSN: [id number]  Arrival date & time 10/11/11  4098   First MD Initiated Contact with Patient 10/11/11 0820      Chief Complaint  Patient presents with  . Fall    (Consider location/radiation/quality/duration/timing/severity/associated sxs/prior treatment) HPI Patient fell this a.m. In kitchen.  States toes caught in hem of pants and fell to floor striking head and right forearm.  Patient and wife unable to get him up from floor.  EMS came and assisted up.  Patient ambulated to car and presents to ed via private vehicle.  Patient denies loc, headache, neck pain, sob,or other injury.  Patient states in usual state of health except Parkinson's.  Patient ambulating without cane or walker this a.m.  Patient has both.   Past Medical History  Diagnosis Date  . Parkinson's disease   . Hypertension     Past Surgical History  Procedure Date  . Abdominal surgery   . Back surgery   . Appendectomy     History reviewed. No pertinent family history.  History  Substance Use Topics  . Smoking status: Former Games developer  . Smokeless tobacco: Never Used  . Alcohol Use: 1.2 oz/week    1 Glasses of wine, 1 Cans of beer per week      Review of Systems  All other systems reviewed and are negative.    Allergies  Penicillins and Sulfa antibiotics  Home Medications   Current Outpatient Rx  Name Route Sig Dispense Refill  . BENAZEPRIL HCL 20 MG PO TABS Oral Take 20 mg by mouth daily.    Marland Kitchen CARBIDOPA-LEVODOPA 25-100 MG PO TABS Oral Take 0.5-1 tablets by mouth See admin instructions. Take 1 tablet in the am, 1 at lunch and 0.5 tablet in the evening.    Marland Kitchen FINASTERIDE 5 MG PO TABS Oral Take 5 mg by mouth daily.      BP 177/63  Pulse 66  Temp 97.8 F (36.6 C) (Oral)  Resp 16  SpO2 99%  Physical Exam  Nursing note and vitals reviewed. Constitutional: He is oriented to person, place, and time. He appears well-developed.  HENT:  Head:         Laceration right temporal  area 4 cm, no tenderness, not bleeding  Eyes: Conjunctivae and EOM are normal. Pupils are equal, round, and reactive to light.  Neck: Normal range of motion. Neck supple.  Cardiovascular: Normal rate.        Mild right anterior chest wall tenderness  Pulmonary/Chest: Effort normal and breath sounds normal.       Mild tenderness right anterior upper chest, no crepitus  Abdominal: Soft. Bowel sounds are normal. There is no tenderness.  Musculoskeletal: Normal range of motion.       Tender right elbow, skin tear and laceration <.25 cm  Neurological: He is alert and oriented to person, place, and time.       Masked faces c.w. parkinson's  Skin: Skin is warm and dry.    ED Course  LACERATION REPAIR Date/Time: 10/11/2011 10:24 AM Performed by: Hilario Quarry Authorized by: Hilario Quarry Consent: Verbal consent obtained. Risks and benefits: risks, benefits and alternatives were discussed Consent given by: spouse Patient understanding: patient states understanding of the procedure being performed Time out: Immediately prior to procedure a "time out" was called to verify the correct patient, procedure, equipment, support staff and site/side marked as required. Body area: head/neck Laceration length: 4 cm Foreign bodies: no foreign bodies Tendon involvement: none  Nerve involvement: none Vascular damage: no Anesthesia: local infiltration Local anesthetic: lidocaine 1% with epinephrine Anesthetic total: 1 ml Patient sedated: no Preparation: Patient was prepped and draped in the usual sterile fashion. Irrigation solution: saline Irrigation method: syringe Amount of cleaning: extensive Debridement: none Skin closure: 4-0 Prolene Number of sutures: 5 Technique: simple Patient tolerance: Patient tolerated the procedure well with no immediate complications.   (including critical care time)  Labs Reviewed - No data to display No results found.   No diagnosis found.   Dg Ribs  Unilateral W/chest Right  10/11/2011  *RADIOLOGY REPORT*  Clinical Data: Fall with right lateral rib pain.  RIGHT RIBS AND CHEST - 3+ VIEW  Comparison: 02/05/2008.  Findings: Frontal view of the chest shows midline trachea and normal heart size.  Probable minimal linear scarring in both lower lobes.  Lungs are otherwise clear.  No pleural fluid.  No pneumothorax.  Dedicated views of the right ribs show no fracture.  IMPRESSION: No acute findings.  Original Report Authenticated By: Reyes Ivan, M.D.   Dg Elbow Complete Right  10/11/2011  *RADIOLOGY REPORT*  Clinical Data: Right elbow pain after a fall.  Laceration.  RIGHT ELBOW - COMPLETE 3+ VIEW  Comparison: None.  Findings: No acute osseous abnormality.  Focal soft tissue swelling is seen over the proximal ulna.  IMPRESSION: Soft tissue swelling without acute osseous abnormality.  Original Report Authenticated By: Reyes Ivan, M.D.   Ct Head Wo Contrast  10/11/2011  *RADIOLOGY REPORT*  Clinical Data: Fall.  Hit head.  CT HEAD WITHOUT CONTRAST  Technique:  Contiguous axial images were obtained from the base of the skull through the vertex without contrast.  Comparison: CT 08/29/2011  Findings: Age appropriate atrophy is unchanged.  Mild chronic microvascular ischemia in the white matter is unchanged.  Negative for acute infarct.  Negative for hemorrhage or mass.  No subdural hematoma is identified.  Negative for skull fracture.  Right sided scalp laceration.  No skull fracture.  IMPRESSION: No acute intracranial abnormality.  Original Report Authenticated By: Camelia Phenes, M.D.   MDM  Patient without acute abnormalities seen on plain film or ct.   Discussed wound care and suture removal with wife.  Patient with increased falls with Parkinson's and advised improved use of aids.        Hilario Quarry, MD 10/11/11 [phone]

## 2011-10-11 NOTE — ED Notes (Signed)
Pt states he got his feet tangled up and he fell in the kitchen this morning, hitting his head and R arm/shoulder, denies LOC, pt has his head and R elbow wrapped in gauze, states he has lacerations on both head and R elbow. Not complaints of a headache, stating his R shoulder/underarm area is a 2/10. Pt in no distress, neuro intact, answers all questions appropriately. R and L pupils 3, brisk. Bleeding controlled on both areas.

## 2011-10-11 NOTE — ED Notes (Signed)
Patient transported to X-ray 

## 2011-10-11 NOTE — Discharge Instructions (Signed)
Sutured Wound Care Sutures are stitches that can be used to close wounds. Wound care helps prevent pain and infection.  HOME CARE INSTRUCTIONS   Rest and elevate the injured area until all the pain and swelling are gone.   Only take over-the-counter or prescription medicines for pain, discomfort, or fever as directed by your caregiver.   After 48 hours, gently wash the area with mild soap and water once a day, or as directed. Rinse off the soap. Pat the area dry with a clean towel. Do not rub the wound. This may cause bleeding.   Follow your caregiver's instructions for how often to change the bandage (dressing). Stop using a dressing after 2 days or after the wound stops draining.   If the dressing sticks, moisten it with soapy water and gently remove it.   Apply ointment on the wound as directed.   Avoid stretching a sutured wound.   Drink enough fluids to keep your urine clear or pale yellow.   Follow up with your caregiver for suture removal as directed.   Use sunscreen on your wound for the next 3 to 6 months so the scar will not darken.  SEEK IMMEDIATE MEDICAL CARE IF:   Your wound becomes red, swollen, hot, or tender.   You have increasing pain in the wound.   You have a red streak that extends from the wound.   There is pus coming from the wound.   You have a fever.   You have shaking chills.   There is a bad smell coming from the wound.   You have persistent bleeding from the wound.  MAKE SURE YOU:   Understand these instructions.   Will watch your condition.   Will get help right away if you are not doing well or get worse.  Document Released: 05/11/2004 Document Revised: 03/23/2011 Document Reviewed: 08/07/2010 [facility] Patient Information 2012 [place], [place].  Sutures out in 5- 7 days.

## 2011-10-11 NOTE — ED Notes (Signed)
Pt's head cleaned, stitches OTA, CDI, pts R elbow cleaned and dressing applied to skin tear, pinpoint laceration dripping blood, dressing applied.

## 2011-10-11 NOTE — ED Notes (Signed)
Per pts wife pt got his feet tangled up in cabinets in kitchen and fell, wife did not witness, wife states no LOC when she went to kitchen to get him after fall, hit head and R arm, lac to head and R elbow.

## 2011-11-07 ENCOUNTER — Ambulatory Visit: Payer: Medicare Other | Attending: Neurology | Admitting: Physical Therapy

## 2011-11-07 DIAGNOSIS — IMO0001 Reserved for inherently not codable concepts without codable children: Secondary | ICD-10-CM | POA: Insufficient documentation

## 2011-11-07 DIAGNOSIS — G20A1 Parkinson's disease without dyskinesia, without mention of fluctuations: Secondary | ICD-10-CM | POA: Insufficient documentation

## 2011-11-07 DIAGNOSIS — R269 Unspecified abnormalities of gait and mobility: Secondary | ICD-10-CM | POA: Insufficient documentation

## 2011-11-07 DIAGNOSIS — G20 Parkinson's disease: Secondary | ICD-10-CM | POA: Insufficient documentation

## 2011-11-07 DIAGNOSIS — R293 Abnormal posture: Secondary | ICD-10-CM | POA: Insufficient documentation

## 2011-11-10 ENCOUNTER — Ambulatory Visit: Payer: Medicare Other | Admitting: Physical Therapy

## 2011-11-14 ENCOUNTER — Ambulatory Visit: Payer: Medicare Other | Admitting: Physical Therapy

## 2011-11-23 ENCOUNTER — Ambulatory Visit: Payer: Medicare Other | Attending: Neurology | Admitting: Physical Therapy

## 2011-11-23 DIAGNOSIS — R293 Abnormal posture: Secondary | ICD-10-CM | POA: Insufficient documentation

## 2011-11-23 DIAGNOSIS — R269 Unspecified abnormalities of gait and mobility: Secondary | ICD-10-CM | POA: Insufficient documentation

## 2011-11-23 DIAGNOSIS — G20A1 Parkinson's disease without dyskinesia, without mention of fluctuations: Secondary | ICD-10-CM | POA: Insufficient documentation

## 2011-11-23 DIAGNOSIS — IMO0001 Reserved for inherently not codable concepts without codable children: Secondary | ICD-10-CM | POA: Insufficient documentation

## 2011-11-23 DIAGNOSIS — G20 Parkinson's disease: Secondary | ICD-10-CM | POA: Insufficient documentation

## 2011-11-24 ENCOUNTER — Ambulatory Visit: Payer: Medicare Other | Admitting: Physical Therapy

## 2011-11-28 ENCOUNTER — Ambulatory Visit: Payer: Medicare Other | Admitting: Physical Therapy

## 2011-11-30 ENCOUNTER — Ambulatory Visit: Payer: Medicare Other | Admitting: *Deleted

## 2011-12-04 ENCOUNTER — Ambulatory Visit: Payer: Medicare Other | Admitting: Physical Therapy

## 2011-12-05 ENCOUNTER — Ambulatory Visit: Payer: Medicare Other | Admitting: Physical Therapy

## 2011-12-07 ENCOUNTER — Ambulatory Visit: Payer: Medicare Other | Admitting: Physical Therapy

## 2011-12-12 ENCOUNTER — Ambulatory Visit: Payer: Medicare Other | Admitting: Physical Therapy

## 2011-12-14 ENCOUNTER — Ambulatory Visit: Payer: Medicare Other | Admitting: Physical Therapy

## 2011-12-19 ENCOUNTER — Ambulatory Visit: Payer: Medicare Other | Admitting: Physical Therapy

## 2011-12-22 ENCOUNTER — Ambulatory Visit: Payer: Medicare Other | Attending: Neurology | Admitting: Physical Therapy

## 2011-12-22 DIAGNOSIS — G20A1 Parkinson's disease without dyskinesia, without mention of fluctuations: Secondary | ICD-10-CM | POA: Insufficient documentation

## 2011-12-22 DIAGNOSIS — R293 Abnormal posture: Secondary | ICD-10-CM | POA: Insufficient documentation

## 2011-12-22 DIAGNOSIS — R269 Unspecified abnormalities of gait and mobility: Secondary | ICD-10-CM | POA: Insufficient documentation

## 2011-12-22 DIAGNOSIS — G20 Parkinson's disease: Secondary | ICD-10-CM | POA: Insufficient documentation

## 2011-12-22 DIAGNOSIS — IMO0001 Reserved for inherently not codable concepts without codable children: Secondary | ICD-10-CM | POA: Insufficient documentation

## 2012-07-23 ENCOUNTER — Ambulatory Visit (INDEPENDENT_AMBULATORY_CARE_PROVIDER_SITE_OTHER): Payer: Medicare Other | Admitting: Neurology

## 2012-07-23 ENCOUNTER — Encounter: Payer: Self-pay | Admitting: Neurology

## 2012-07-23 VITALS — BP 168/58 | HR 60 | Temp 97.1°F | Ht 67.0 in | Wt 155.0 lb

## 2012-07-23 DIAGNOSIS — Z8679 Personal history of other diseases of the circulatory system: Secondary | ICD-10-CM | POA: Insufficient documentation

## 2012-07-23 DIAGNOSIS — Z9181 History of falling: Secondary | ICD-10-CM

## 2012-07-23 DIAGNOSIS — R413 Other amnesia: Secondary | ICD-10-CM

## 2012-07-23 DIAGNOSIS — C61 Malignant neoplasm of prostate: Secondary | ICD-10-CM | POA: Insufficient documentation

## 2012-07-23 DIAGNOSIS — G20 Parkinson's disease: Secondary | ICD-10-CM

## 2012-07-23 DIAGNOSIS — R296 Repeated falls: Secondary | ICD-10-CM

## 2012-07-23 DIAGNOSIS — I10 Essential (primary) hypertension: Secondary | ICD-10-CM | POA: Insufficient documentation

## 2012-07-23 DIAGNOSIS — E785 Hyperlipidemia, unspecified: Secondary | ICD-10-CM | POA: Insufficient documentation

## 2012-07-23 MED ORDER — CARBIDOPA-LEVODOPA 25-100 MG PO TABS: 1.0000 | ORAL_TABLET | Freq: Three times a day (TID) | ORAL | Status: DC

## 2012-07-23 NOTE — Progress Notes (Signed)
Subjective:    Patient ID: Jermaine Lee is a 79 y.o. male.  HPI  Interim history:  Jermaine Lee is a very pleasant 79 year old right-handed gentleman who presents for followup consultation of his Parkinson's disease, he is accompanied by his wife today. Today is his first visit with me in he previously followed by Dr. Sandria Manly for almost 10 years. He has an underlying medical history of hypertension, hyperlipidemia, prostate cancer, irritable bowel syndrome, fall with subarachnoid hemorrhage and right occipital parietal subdural hemorrhage, degenerative disc disease, status post cataract surgery, status post appendectomy, status post hernia surgeries, and right lumbar radiculopathy surgery. He was last seen by Dr. Sandria Manly on 04/19/2012, at which time Dr. Sandria Manly felt that the patient had frequent freezing episodes and falls. His falls assessment total score was 20. Because of hallucinations no medication changes were done at the time. His current medications are carbidopa-levodopa 25-100 mg 1-1/2 at 7 AM, 1 at 9 and half at bedtime. He is on benazepril 10 mg once daily, finasteride 5 mg once daily, vitamin D. He had a near fall today and states he stumbled. He uses a walker in house. His wife has 6-8 h of help per day. He has a son in [place] and another son in [place] and a daughter in [place]. He has VH daily. He has no new complaints and his wife reports no major changes. She does report that he has been receiving Sinemet one pill at breakfast, one pill at lunch and half a pill at night.  I reviewed Dr. 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 He fell on 02/05/2008 with CT head showing a small SAH and a R sided occipitoparietal SDH without acute stroke. He is dependent in his ADLs. He does not drive a car. He exercises 2  days/week. He uses a single-point cane, walker, and wheelchair as needed. His wife has assistance for 6 hours 6 days per week. He has daytime sleepiness. He sleeps well at night. He has VH and are non-frightening. A previous trial of 5 mg and then 10 mg of selegiline was not successful and he was tapered off. He has bladder urgency and occasional bladder incontinence. At times when he lies down he has vertigo with nystagmus. He has bladder incontinence. He fell on 02/2010 and broke his nose. He has freezing and difficulty initiating gait. On 03/13/11 = MMSE 26/30, CDT 3/4, AFT 13. Geriatric depression scale 3/15, falls assessment tool score 23. On 05/30/11 = MMSE 24/30, CDT 2/4, AFT 12. He has had OH. He sleeps from 11 PM to 7 AM and takes naps from 9 AM - 10:30 AM and 1 PM- 3:30 PM. He also at times takes a nap at 9 PM-11PM. Blood studies from 03/30/2011 were normal CBC, glucose 141, sed rate 24, and rest of CMP normal. He fell on 10/11/11 and had a R occipital scalp laceration requiring 5 stitches. CT scan of the brain showed no acute abnormalities. He previously had a CT scan of the cervical spine on 08/29/2011. On 10/12/11 = MMSE 20/30, CDT 2/4, AFT 13, Geriatric depression scale 1/15, Falls assessment tool score 22. He had PT for gait disturbance from 10/18/11 to 12/22/11. On 04/20/2011 = MMSE 23/30, CDT 4/4, AFT 13.  His Past Medical History Is Significant For: Past Medical History  Diagnosis  Date  . Parkinson's disease   . Hypertension   . Essential hypertension, benign 07/23/2012  . Other and unspecified hyperlipidemia 07/23/2012  . Prostate cancer 07/23/2012  . Recurrent falls 07/23/2012  . Hx of subdural hematoma 07/23/2012  . H/O subarachnoid hemorrhage 07/23/2012  . Parkinson's disease 07/23/2012  . Memory loss 07/23/2012    His Past Surgical History Is Significant For: Past Surgical History  Procedure Laterality Date  . Abdominal surgery    . Back surgery    . Appendectomy      His Family History Is  Significant For: No family history on file.  His Social History Is Significant For: History   Social History  . Marital Status: Married    Spouse Name: N/A    Number of Children: N/A  . Years of Education: N/A   Social History Main Topics  . Smoking status: Former Games developer  . Smokeless tobacco: Never Used  . Alcohol Use: 1.2 oz/week    1 Glasses of wine, 1 Cans of beer per week  . Drug Use: No  . Sexually Active:    Other Topics Concern  . None   Social History Narrative  . None    His Allergies Are:  Allergies  Allergen Reactions  . Penicillins Hives  . Sulfa Antibiotics Hives  :   His Current Medications Are:  Outpatient Encounter Prescriptions as of 07/23/2012  Medication Sig Dispense Refill  . benazepril (LOTENSIN) 20 MG tablet Take 20 mg by mouth daily.      . carbidopa-levodopa (SINEMET IR) 25-100 MG per tablet Take 0.5-1 tablets by mouth See admin instructions. Take 1 tablet in the am, 1 at lunch and 0.5 tablet in the evening.      . cholecalciferol (VITAMIN D) 1000 UNITS tablet Take 1,000 Units by mouth daily.      . finasteride (PROSCAR) 5 MG tablet Take 5 mg by mouth daily.      . vitamin C (ASCORBIC ACID) 500 MG tablet Take 500 mg by mouth daily.       No facility-administered encounter medications on file as of 07/23/2012.  : Review of Systems  HENT: Positive for rhinorrhea.   Gastrointestinal: Positive for constipation.       Incontinence  Genitourinary:       Incontinence  Neurological:       Memory loss  Hematological: Bruises/bleeds easily.  Psychiatric/Behavioral: Positive for confusion.    Objective:  Neurologic Exam  Physical Exam Physical Examination:   Filed Vitals:   07/23/12 1050  BP: 168/58  Pulse: 60  Temp: 97.1 F (36.2 C)    General Examination: The patient is a very pleasant 79 y.o. male in no acute distress.  HEENT: Normocephalic, atraumatic, pupils are equal, round and reactive to light and accommodation. Funduscopic  exam is normal with sharp disc margins noted. Extraocular tracking shows mild saccadic breakdown without nystagmus noted. There is no limitation to his gaze. There is mild decrease in eye blink rate. Hearing is impaired. Tympanic membranes are clear bilaterally. Face is symmetric with mild facial masking and normal facial sensation. There is no lip, neck or jaw tremor. Neck is moderately rigid with intact passive ROM. There are no carotid bruits on auscultation. Oropharynx exam reveals mild mouth dryness. No significant airway crowding is noted. Mallampati is class II. Tongue protrudes centrally and palate elevates symmetrically.    Chest: is clear to auscultation without wheezing, rhonchi or crackles noted.  Heart: sounds are regular and normal without murmurs, rubs  or gallops noted.   Abdomen: is soft, non-tender and non-distended with normal bowel sounds appreciated on auscultation.  Extremities: There is no pitting edema in the distal lower extremities bilaterally.   Skin: is warm and dry with no trophic changes noted.  Musculoskeletal: exam reveals no obvious joint deformities, tenderness or joint swelling or erythema.  Neurologically:  Mental status: The patient is awake and alert, paying good  attention. He is able to to partially provide the history. His wife provides details. He is oriented to: person, place, time/date, situation, day of week, month of year and year. His memory, attention, language and knowledge are impaired. There is no aphasia, agnosia, apraxia or anomia. There is a mild degree of bradyphrenia. Speech is mildly hypophonic with moderate dysarthria noted. Mood is congruent and affect is blunted.  His Fall Assessment Tool Score is 12.   Cranial nerves are as described above under HEENT exam. In addition, shoulder shrug is normal with equal shoulder height noted.  Motor exam: Normal bulk, and strength for age is noted. Tone is mildly rigid with absence of cogwheeling in  the bilateral extremities. There is overall moderate bradykinesia. There is no drift or rebound. There is no resting tremor.  Reflexes are trace in the upper extremities and none in the lower extremities. Fine motor skills exam reveals: Finger taps are mildly impaired on the right and mildly impaired on the left. Hand movements are mildly impaired on the right and mildly impaired on the left. RAP (rapid alternating patting) is mildly impaired on the right and mildly impaired on the left. Foot taps are moderately impaired on the right and moderately impaired on the left. Foot agility (in the form of heel stomping) is moderately impaired on the right and moderately impaired on the left.    Cerebellar testing shows no dysmetria or intention tremor on finger to nose testing. Heel to shin is unremarkable bilaterally. There is no truncal or gait ataxia.   Sensory exam is intact to light touch, pinprick, vibration, temperature sense and proprioception in the upper and lower extremities.   Gait, station and balance exan: I did not have him stand or walk for me today.  Assessment and Plan:   Assessment and Plan:  In summary, Jermaine Lee is a very pleasant 79 y.o.-year old male with a history of Parkinson's disease with memory loss. He has a history also of hallucinations and a gait dysfunction with freezing and postural instability. His physical exam seems fairly stable when I compare my findings with Dr. Imagene Gurney.   I had a long chat with the patient and his wife about my findings. I suggested a minor change today, and that I would recommend that he take Sinemet one pill 3 times a day, therefore, he will increase his evening dose to a whole pill. He and his wife were in agreement. We also talked about maintaining a healthy lifestyle in general. I encouraged the patient to eat healthy, exercise daily and keep well hydrated, to keep a scheduled bedtime and wake time routine, to not skip any meals and eat  healthy snacks in between meals and to have protein with every meal. I would like for him to try exercising his lower extremities by using a pedaling device. His wife indicated that she would look into it. I answered all their questions today and the patient and his wife were in agreement with the above outlined plan. I would like to see the patient back in 4 months,  sooner if the need arises and encouraged them to call with any interim questions, concerns, problems or updates and refill requests.

## 2012-07-23 NOTE — Patient Instructions (Addendum)
I think overall you are doing fairly well but I do want to suggest a few things today:  Remember to drink plenty of fluid, eat healthy meals and do not skip any meals. Try to eat protein with a every meal and eat a healthy snack such as fruit or nuts in between meals. Try to keep a regular sleep-wake schedule and try to exercise daily, particularly in the form of walking, if you can or using a pedaling device.   Engage in social activities in your community and with your family and try to keep up with current events by reading the newspaper or watching the news.   As far as your medications are concerned, I would like to suggest no changes today, except increasing your evening dose of carbidopa-levodopa to a whole, therefore, you will be taking 1 pill three times a day.   I would like to see you back in 4 months, sooner if we need to. Please call us with any interim questions, concerns, problems, updates or refill requests.  Brett Canales is my clinical assistant and will answer any of your questions and relay your messages to me and also relay most of my messages to you.  Our phone number is [phone]. We also have an after hours call service for urgent matters and there is a physician on-call for urgent questions. For any emergencies you know to call 911 or go to the nearest emergency room.

## 2012-08-13 IMAGING — CT CT HEAD W/O CM
2 series · 17 of 30 positions shown, 20 images · non-contrast
Comparison: CT 08/29/2011

CLINICAL DATA: Fall.  Hit head.

CT HEAD WITHOUT CONTRAST
TECHNIQUE: Contiguous axial images were obtained from the base of
the skull through the vertex without contrast.

[Series 2: head w/o · axial · non-contrast · 0.43mm/px · z∈[-42,+78]mm · 9 of 30 slices shown, 12 images]
[im 3/30  brain]
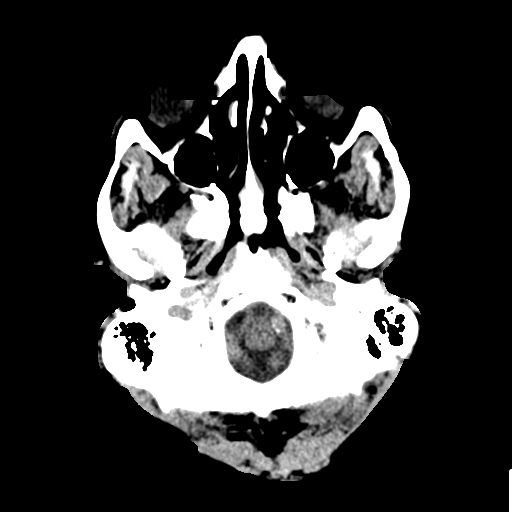
[im 3/30  bone]
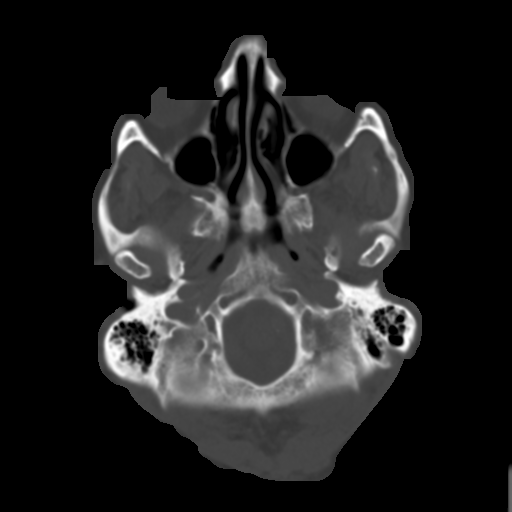
[im 6/30  brain]
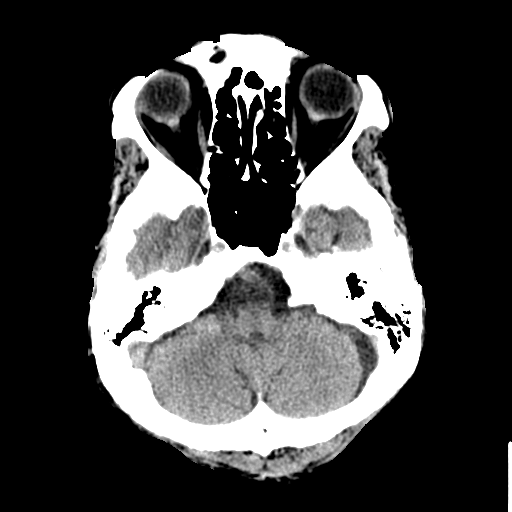
[im 9/30  brain]
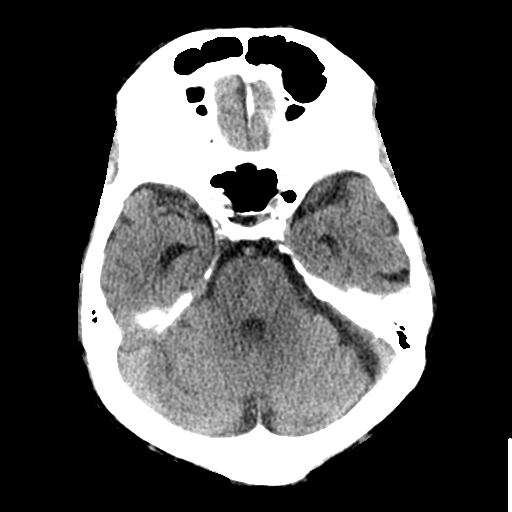
[im 12/30  brain]
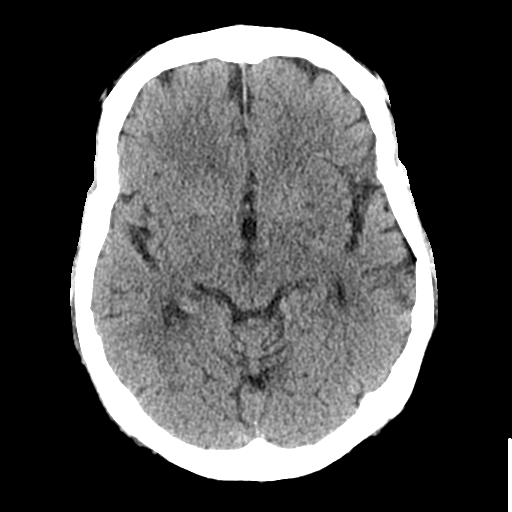
[im 15/30  brain]
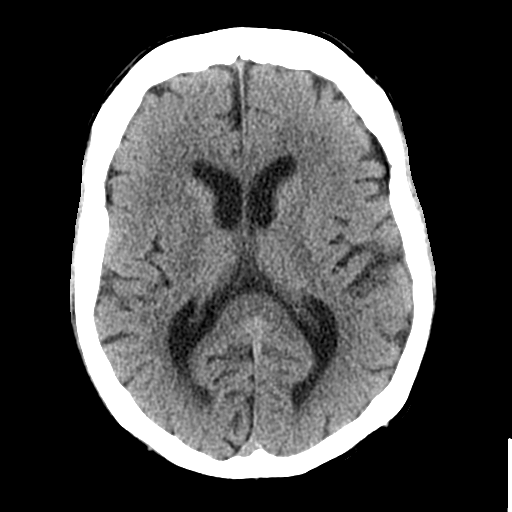
[im 15/30  bone]
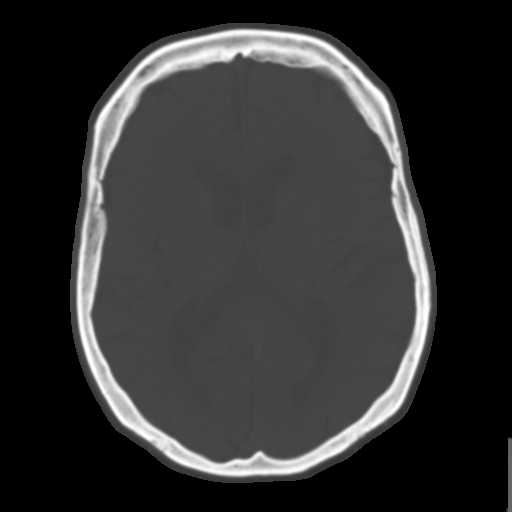
[im 18/30  brain]
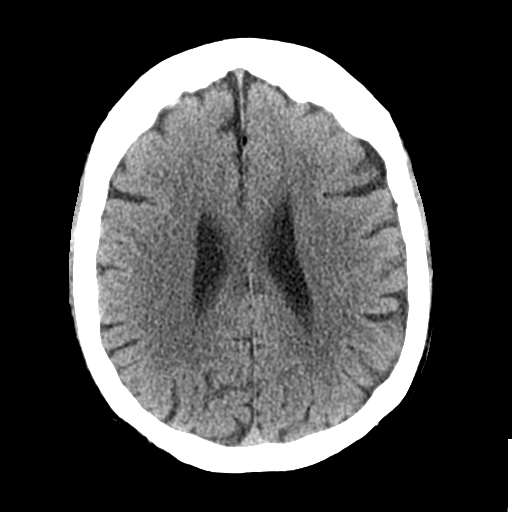
[im 21/30  brain]
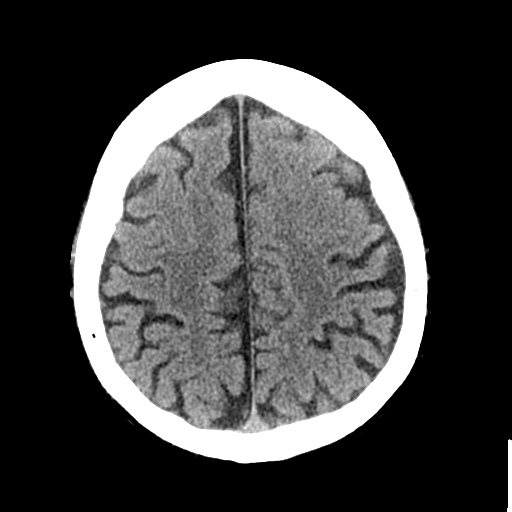
[im 24/30  brain]
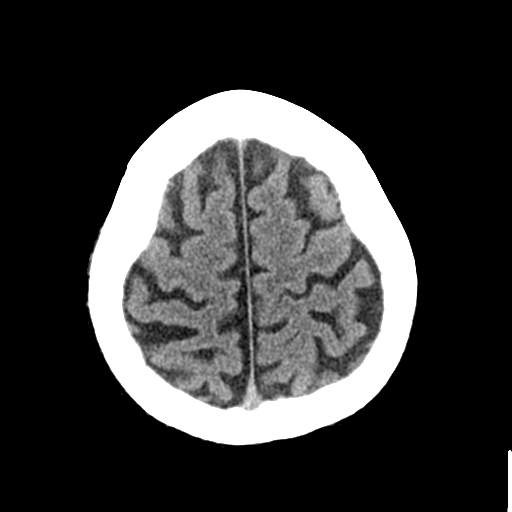
[im 27/30  brain]
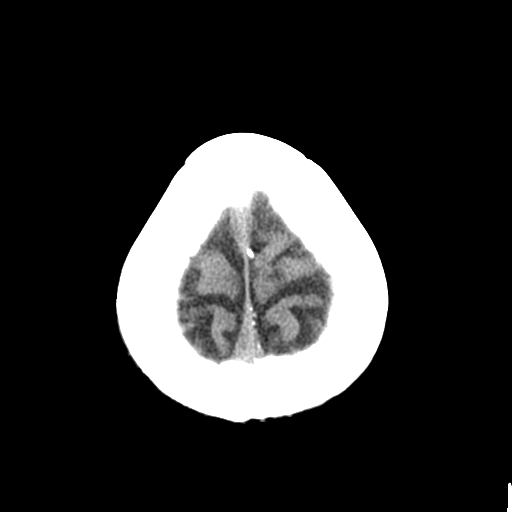
[im 27/30  bone]
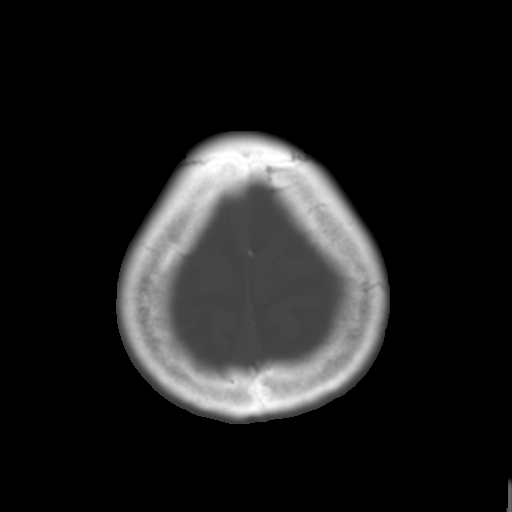

[Series 3: bone windows · axial · 0.43mm/px · z∈[-37,+77]mm · 8 of 50 slices shown]
[im 6/50  bone]
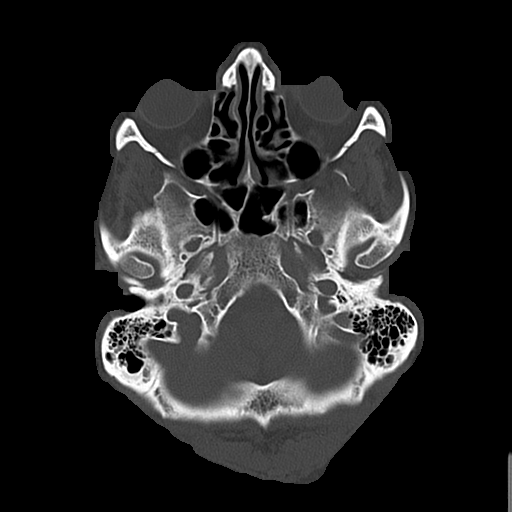
[im 11/50  bone]
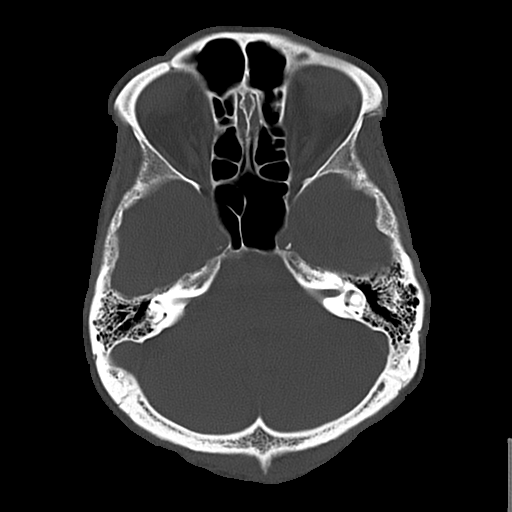
[im 17/50  bone]
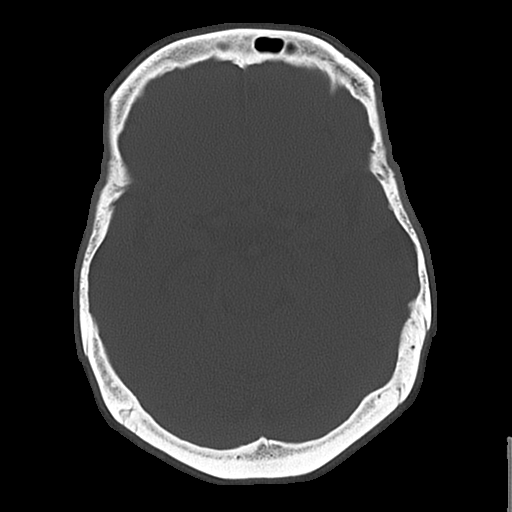
[im 22/50  bone]
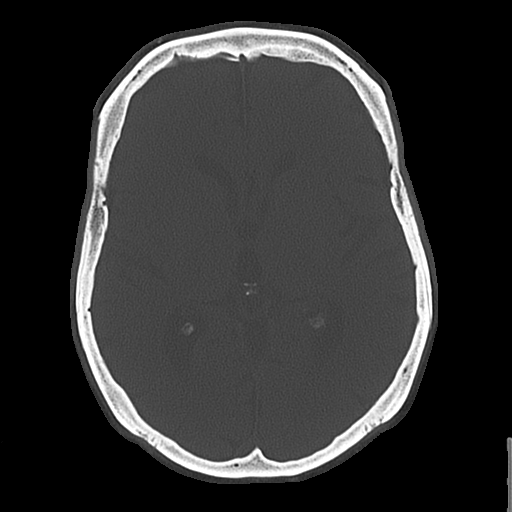
[im 28/50  bone]
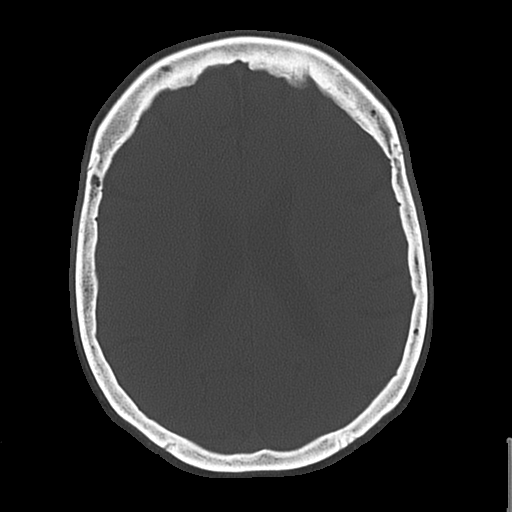
[im 33/50  bone]
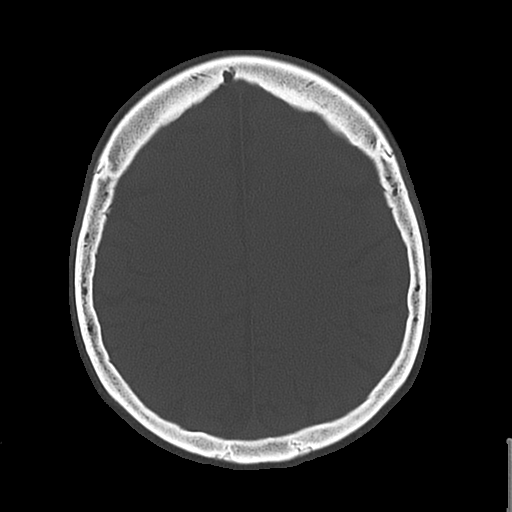
[im 39/50  bone]
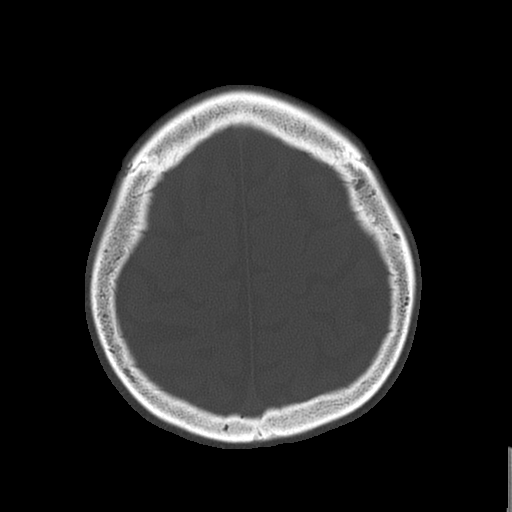
[im 44/50  bone]
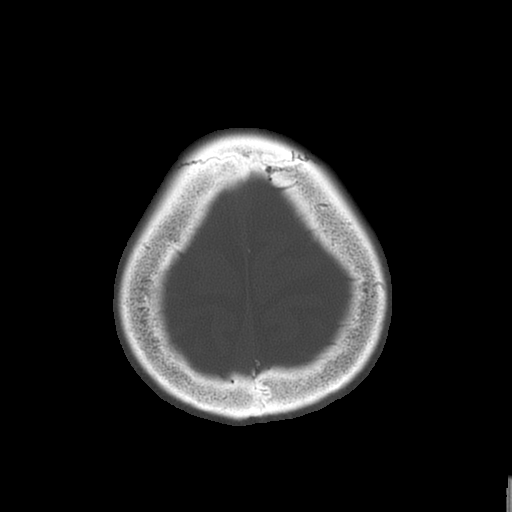

[17 of 30 positions shown; findings below may reference images not displayed]

FINDINGS: Age appropriate atrophy is unchanged.  Mild chronic
microvascular ischemia in the white matter is unchanged.  Negative
for acute infarct.  Negative for hemorrhage or mass.  No subdural
hematoma is identified.  Negative for skull fracture.

Right sided scalp laceration.  No skull fracture.
IMPRESSION: No acute intracranial abnormality.

## 2012-08-13 IMAGING — CR DG RIBS W/ CHEST 3+V*R*
5 series · 5 of 5 positions shown · non-contrast
Comparison: 02/05/2008.

CLINICAL DATA: Fall with right lateral rib pain.

RIGHT RIBS AND CHEST - 3+ VIEW

[x chest ap]
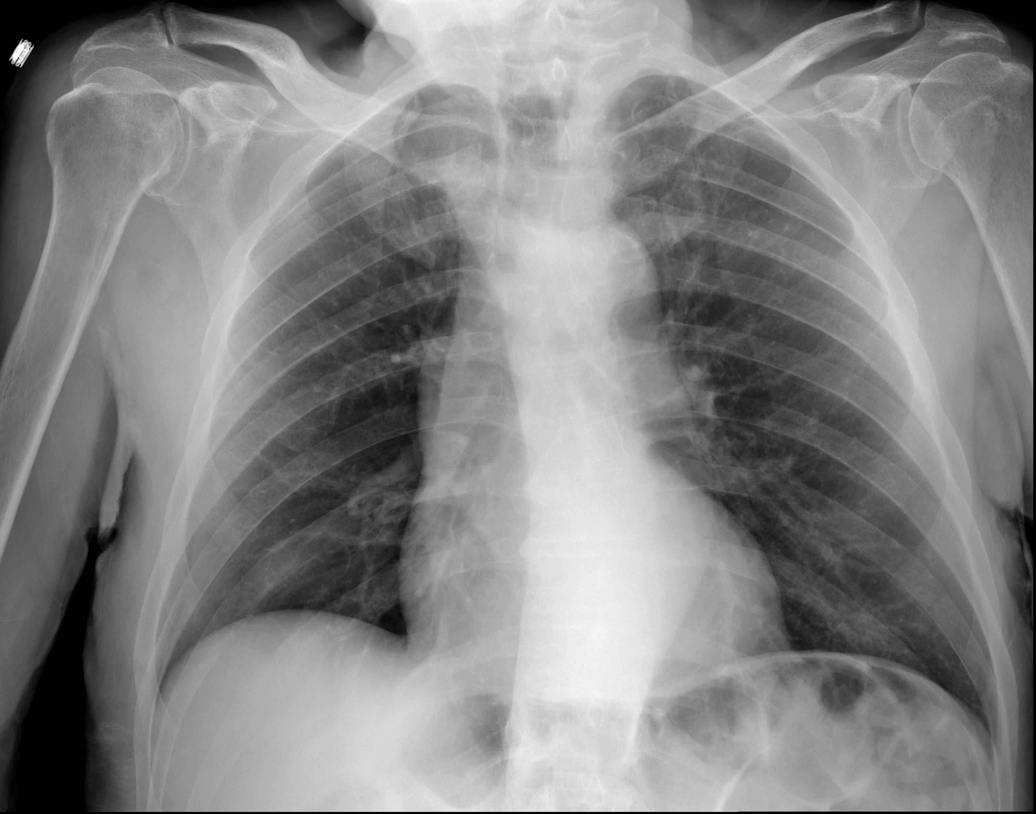

[t ribs ap lower right]
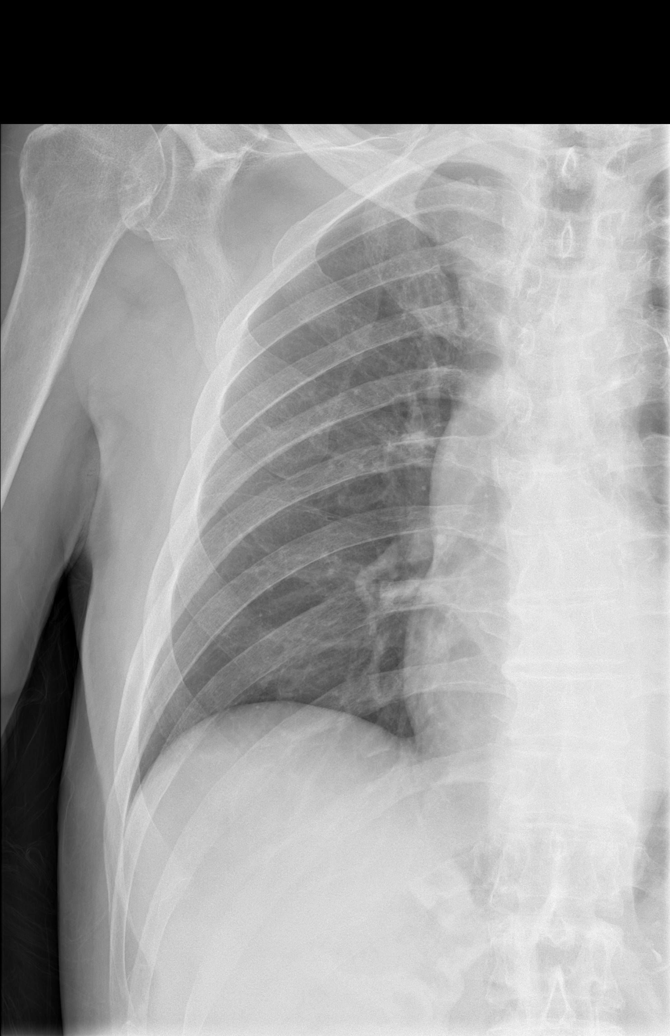

[t ribs ap upper right]
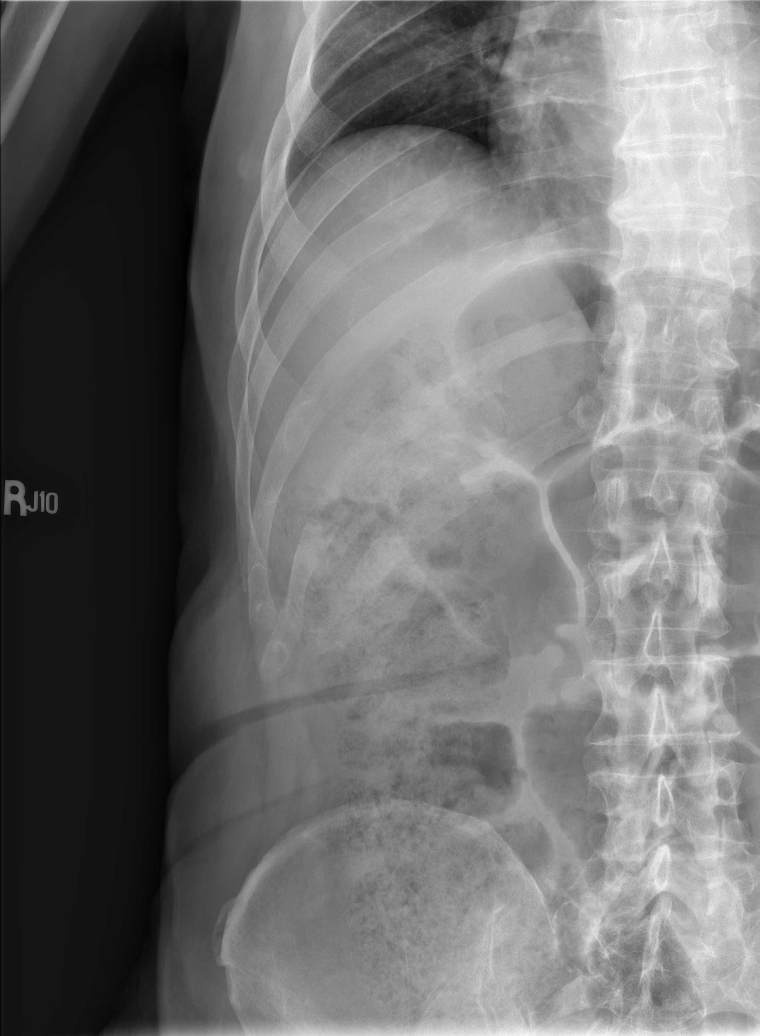

[t ribs rpo right (1 of 2)]
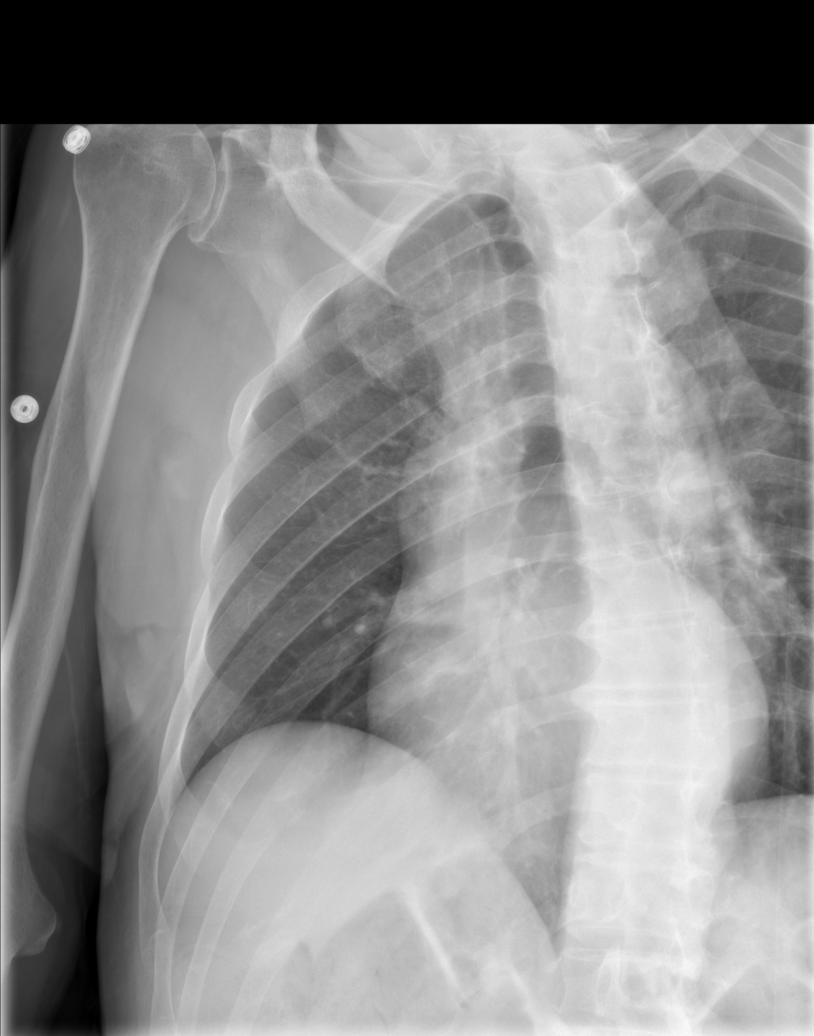

[t ribs rpo right (2 of 2)]
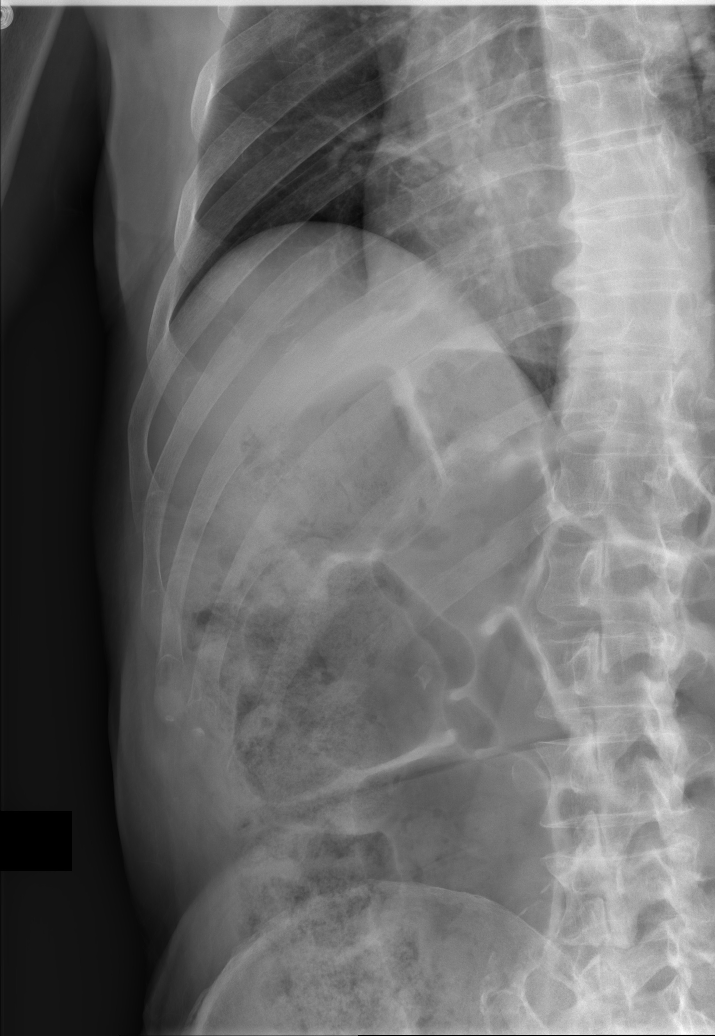

[5 of 5 positions shown; findings below may reference images not displayed]

FINDINGS: Frontal view of the chest shows midline trachea and
normal heart size.  Probable minimal linear scarring in both lower
lobes.  Lungs are otherwise clear.  No pleural fluid.  No
pneumothorax.

Dedicated views of the right ribs show no fracture.
IMPRESSION: No acute findings.

## 2012-08-19 ENCOUNTER — Other Ambulatory Visit: Payer: Self-pay

## 2012-08-19 MED ORDER — BENAZEPRIL HCL 10 MG PO TABS: 10.0000 mg | ORAL_TABLET | Freq: Every day | ORAL | Status: AC

## 2012-08-19 NOTE — Telephone Encounter (Signed)
Former [facility] patient assigned to Dr Athar.  

## 2012-08-27 ENCOUNTER — Telehealth: Payer: Self-pay | Admitting: *Deleted

## 2012-08-27 ENCOUNTER — Encounter: Payer: Self-pay | Admitting: Neurology

## 2012-08-27 ENCOUNTER — Ambulatory Visit (INDEPENDENT_AMBULATORY_CARE_PROVIDER_SITE_OTHER): Payer: Medicare Other | Admitting: Neurology

## 2012-08-27 VITALS — BP 150/67 | HR 55 | Temp 98.5°F

## 2012-08-27 DIAGNOSIS — F411 Generalized anxiety disorder: Secondary | ICD-10-CM

## 2012-08-27 DIAGNOSIS — G20 Parkinson's disease: Secondary | ICD-10-CM

## 2012-08-27 MED ORDER — BUSPIRONE HCL 15 MG PO TABS: 15.0000 mg | ORAL_TABLET | Freq: Every day | ORAL | Status: DC | PRN

## 2012-08-27 NOTE — Progress Notes (Signed)
Subjective:    Patient ID: Jermaine Lee is a 79 y.o. male.  HPI  Interim history:   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 I first met him on 07/23/12, at which I suggested that he take Sinemet one pill 3 times a day. He is a former patient of Dr.  Imagene Gurney and was last seen by him on 04/19/2012. I suggested a 4 month followup but he presents for a sooner than scheduled appointment because of more restlessness. His wife decreased his Sinemet back to 1/2 pill at the last dose, so he is back to taking: 1-1-1/2 daily and she feels he does better with that.    His wife has 6-8 h of help per day. He has a son in [place] and another son in [place] and a daughter in [place]. He has VH daily. He has no new complaints and his wife reports no major changes.  He was diagnosed with Parkinson's disease in February 2005 and initially treated with dopaminergic agonists. He fell in October 2009 and sustained a subarachnoid as well as a subdural hematoma. He walks with a cane or walker and also has a wheelchair. His wife has assistance daily for about 6 hours. He has been having hallucinations and daytime somnolence. He was tapered off of selegiline in the past. He also has had vertigo. He has had bladder incontinence. In November 2011 he fell and broke his nose. He has had some memory loss. In November 2012 his MMSE was 26, clock drawing was 3, animal fluency was 13. In February 2013 his MMSE was 24, clock drawing was 2, animal fluency was 12. He has had some orthostatic hypotension and recurrent freezing episodes. In June 2013 he fell and sustained a right parietal occipital scalp laceration requiring stitches. CT head showed no acute abnormalities. In June 2013 his MMSE was 20, clock drawing was 2, animal fluency was 13. He had physical therapy July through September last year. In January 2014 his MMSE was 23, clock  drawing was 4, animal fluency was 13.  His Past Medical History Is Significant For: Past Medical History  Diagnosis Date  . Parkinson's disease   . Hypertension   . Essential hypertension, benign 07/23/2012  . Other and unspecified hyperlipidemia 07/23/2012  . Prostate cancer 07/23/2012  . Recurrent falls 07/23/2012  . Hx of subdural hematoma 07/23/2012  . H/O subarachnoid hemorrhage 07/23/2012  . Parkinson's disease 07/23/2012  . Memory loss 07/23/2012    His Past Surgical History Is Significant For: Past Surgical History  Procedure Laterality Date  . Abdominal surgery    . Back surgery    . Appendectomy      His Family History Is Significant For: No family history on file.  His Social History Is Significant For: History   Social History  . Marital Status: Married    Spouse Name: N/A    Number of Children: N/A  . Years of Education: N/A   Social History Main Topics  . Smoking status: Former Games developer  . Smokeless tobacco: Never Used  . Alcohol Use: 1.2 oz/week    1 Glasses of wine, 1 Cans of beer per week  . Drug Use: No  . Sexually Active:    Other Topics Concern  . None   Social History Narrative  . None    His Allergies Are:  Allergies  Allergen Reactions  . Penicillins Hives  . Sulfa  Antibiotics Hives  :   His Current Medications Are:  Outpatient Encounter Prescriptions as of 08/27/2012  Medication Sig Dispense Refill  . benazepril (LOTENSIN) 10 MG tablet Take 1 tablet (10 mg total) by mouth daily.  90 tablet  3  . carbidopa-levodopa (SINEMET IR) 25-100 MG per tablet Take 1 tablet by mouth 3 (three) times daily. Take 1 tablet in the am, 1 at lunch and 1 tablet in the evening.  270 tablet  3  . cholecalciferol (VITAMIN D) 1000 UNITS tablet Take 1,000 Units by mouth daily.      . finasteride (PROSCAR) 5 MG tablet Take 5 mg by mouth daily.      . vitamin C (ASCORBIC ACID) 500 MG tablet Take 500 mg by mouth daily.      . busPIRone (BUSPAR) 15 MG tablet Take 1 tablet  (15 mg total) by mouth daily as needed.  30 tablet  5   No facility-administered encounter medications on file as of 08/27/2012.  :  Review of Systems  HENT: Positive for rhinorrhea.   Gastrointestinal:       Incontinence  Hematological: Bruises/bleeds easily.  Psychiatric/Behavioral: Positive for hallucinations and confusion.    Objective:  Neurologic Exam  Physical Exam  Physical Examination:   Filed Vitals:   08/27/12 1547  BP: 150/67  Pulse: 55  Temp: 98.5 F (36.9 C)   General Examination: The patient is a very pleasant 79 y.o. male in no acute distress.  HEENT: Normocephalic, atraumatic, pupils are equal, round and reactive to light and accommodation. Funduscopic exam is normal with sharp disc margins noted. Extraocular tracking shows mild saccadic breakdown without nystagmus noted. There is no limitation to his gaze. There is mild decrease in eye blink rate. Hearing is impaired. Tympanic membranes are clear bilaterally. Face is symmetric with mild facial masking and normal facial sensation. There is no lip, neck or jaw tremor. Neck is moderately rigid with intact passive ROM. There are no carotid bruits on auscultation. Oropharynx exam reveals mild mouth dryness. No significant airway crowding is noted. Mallampati is class II. Tongue protrudes centrally and palate elevates symmetrically.    Chest: is clear to auscultation without wheezing, rhonchi or crackles noted.  Heart: sounds are regular and normal without murmurs, rubs or gallops noted.   Abdomen: is soft, non-tender and non-distended with normal bowel sounds appreciated on auscultation.  Extremities: There is no pitting edema in the distal lower extremities bilaterally.   Skin: is warm and dry with no trophic changes noted.  Musculoskeletal: exam reveals no obvious joint deformities, tenderness or joint swelling or erythema.  Neurologically:  Mental status: The patient is awake and alert, paying good  attention. He is able to to partially provide the history. His wife provides details. He is oriented to: person, place, time/date, situation, day of week, month of year and year. His memory, attention, language and knowledge are impaired. There is no aphasia, agnosia, apraxia or anomia. There is a mild degree of bradyphrenia. Speech is mildly hypophonic with moderate dysarthria noted. Mood is congruent and affect is blunted.  His Fall Assessment Tool Score is 13 today.   Cranial nerves are as described above under HEENT exam. In addition, shoulder shrug is normal with equal shoulder height noted.  Motor exam: Normal bulk, and strength for age is noted. Tone is mildly rigid with absence of cogwheeling in the bilateral extremities. There is overall moderate bradykinesia. There is no drift or rebound. There is no resting tremor.  Reflexes are  trace in the upper extremities and none in the lower extremities. Fine motor skills exam reveals: Finger taps are mildly impaired on the right and mildly impaired on the left. Hand movements are mildly impaired on the right and mildly impaired on the left. RAP (rapid alternating patting) is mildly impaired on the right and mildly impaired on the left. Foot taps are moderately impaired on the right and moderately impaired on the left. Foot agility (in the form of heel stomping) is moderately impaired on the right and moderately impaired on the left.    Cerebellar testing shows no dysmetria or intention tremor on finger to nose testing. Heel to shin is unremarkable bilaterally. There is no truncal or gait ataxia.   Sensory exam is intact to light touch, pinprick, vibration, temperature sense and proprioception in the upper and lower extremities.   Gait, station and balance exan: I did not have him stand or walk for me today. He is situated in his [facility] and did not bring his 2 wheeled walker.   Assessment and Plan:   In summary, Brennin Durfee is a very pleasant 79  y.o.-year old male with a history of Parkinson's disease with memory loss. He has a history also of hallucinations and a gait dysfunction with freezing and postural instability. He has had more restlessness, but I do not detect much in the way of change since my last exam.   I had a long chat with the patient and his wife about my findings. I suggested a trial of Buspar 15 mg as needed for restlessness, and anxiety: take 1 pill as needed once a day. If he is noted to be too sleepy from it, he can take 1/2 pill as needed, up to twice a day. I answered all their questions today and the patient and his wife were in agreement with the above outlined plan. I would like to see the patient back in 3 months, sooner if the need arises and encouraged them to call with any interim questions, concerns, problems or updates and refill requests.

## 2012-08-27 NOTE — Telephone Encounter (Signed)
Per dr. Frances Furbish the patient needs to come in to be seen.

## 2012-08-27 NOTE — Telephone Encounter (Deleted)
He is more confused and antzy and  has had a loss of appetiate he cant

## 2012-08-27 NOTE — Telephone Encounter (Signed)
Called patient to see how was he doing his wife  stated the patient is more confused and cant sit still. States his appetite has change and feels like the carbidopa levodopa 25-100 3 times daily is not working. Please advise .

## 2012-08-27 NOTE — Patient Instructions (Addendum)
I think overall you are doing fairly well but I do want to suggest a few things today:  Remember to drink plenty of fluid, eat healthy meals and do not skip any meals. Try to eat protein with a every meal and eat a healthy snack such as fruit or nuts in between meals. Try to keep a regular sleep-wake schedule and try to exercise daily, particularly in the form of walking, 20-30 minutes a day, if you can.   Engage in social activities in your community and with your family and try to keep up with current events by reading the newspaper or watching the news.   As far as your medications are concerned, I would like to suggest a trial of buspirone for your restlessness, and anxiety: take 1 pill as needed once a day. If you are too sleepy from it, you can take 1/2 pill as needed, up to twice a day.    I would like to see you back in 3 months, sooner if we need to. Please call us with any interim questions, concerns, problems, updates or refill requests.  Please also call us for any test results so we can go over those with you on the phone. Brett Canales is my clinical assistant and will answer any of your questions and relay your messages to me and also relay most of my messages to you.  Our phone number is [phone]. We also have an after hours call service for urgent matters and there is a physician on-call for urgent questions. For any emergencies you know to call 911 or go to the nearest emergency room.

## 2012-09-09 ENCOUNTER — Encounter (HOSPITAL_COMMUNITY): Payer: Self-pay | Admitting: Nurse Practitioner

## 2012-09-09 ENCOUNTER — Emergency Department (HOSPITAL_COMMUNITY): Payer: Medicare Other

## 2012-09-09 ENCOUNTER — Emergency Department (HOSPITAL_COMMUNITY)
Admission: EM | Admit: 2012-09-09 | Discharge: 2012-09-09 | Disposition: A | Discharge status: Home / Self Care | Payer: Medicare Other | Attending: Emergency Medicine | Admitting: Emergency Medicine

## 2012-09-09 DIAGNOSIS — Z87891 Personal history of nicotine dependence: Secondary | ICD-10-CM | POA: Insufficient documentation

## 2012-09-09 DIAGNOSIS — Z8679 Personal history of other diseases of the circulatory system: Secondary | ICD-10-CM | POA: Insufficient documentation

## 2012-09-09 DIAGNOSIS — R413 Other amnesia: Secondary | ICD-10-CM | POA: Insufficient documentation

## 2012-09-09 DIAGNOSIS — Z9181 History of falling: Secondary | ICD-10-CM | POA: Insufficient documentation

## 2012-09-09 DIAGNOSIS — Y92009 Unspecified place in unspecified non-institutional (private) residence as the place of occurrence of the external cause: Secondary | ICD-10-CM | POA: Insufficient documentation

## 2012-09-09 DIAGNOSIS — S0003XA Contusion of scalp, initial encounter: Secondary | ICD-10-CM | POA: Insufficient documentation

## 2012-09-09 DIAGNOSIS — IMO0002 Reserved for concepts with insufficient information to code with codable children: Secondary | ICD-10-CM | POA: Insufficient documentation

## 2012-09-09 DIAGNOSIS — C61 Malignant neoplasm of prostate: Secondary | ICD-10-CM | POA: Insufficient documentation

## 2012-09-09 DIAGNOSIS — I10 Essential (primary) hypertension: Secondary | ICD-10-CM | POA: Insufficient documentation

## 2012-09-09 DIAGNOSIS — Y9389 Activity, other specified: Secondary | ICD-10-CM | POA: Insufficient documentation

## 2012-09-09 DIAGNOSIS — W1809XA Striking against other object with subsequent fall, initial encounter: Secondary | ICD-10-CM | POA: Insufficient documentation

## 2012-09-09 DIAGNOSIS — E785 Hyperlipidemia, unspecified: Secondary | ICD-10-CM | POA: Insufficient documentation

## 2012-09-09 DIAGNOSIS — G20 Parkinson's disease: Secondary | ICD-10-CM | POA: Insufficient documentation

## 2012-09-09 DIAGNOSIS — Z79899 Other long term (current) drug therapy: Secondary | ICD-10-CM | POA: Insufficient documentation

## 2012-09-09 DIAGNOSIS — G20A1 Parkinson's disease without dyskinesia, without mention of fluctuations: Secondary | ICD-10-CM | POA: Insufficient documentation

## 2012-09-09 DIAGNOSIS — S0083XA Contusion of other part of head, initial encounter: Secondary | ICD-10-CM | POA: Insufficient documentation

## 2012-09-09 NOTE — ED Notes (Signed)
[id number]<AV> Expected date:<BR> Expected time:<BR> Means of arrival:<BR> Comments:<BR> ems- fall- hit head elderly

## 2012-09-09 NOTE — ED Notes (Signed)
Per EMS:  Pt was sitting in a small kitchen nook and he fell and hit his head on the wall as he lost his balance and fell.  No loss of consciousness; large right frontal lobe hematoma with small amount of serous fluid draining per EMS; c/o 1/10 pain.  EMS applied ice.  Pt is complaining of feeling groggy and sleeping.  Denies any general pain other than where he hit his head.  Pt's wife is on her way.  Hx:  Parkinson's, not on any anticoagulants per pt. Pt is a retired Investment banker, corporate.

## 2012-09-09 NOTE — ED Provider Notes (Signed)
History     CSN: [id number]  Arrival date & time 09/09/12  1343   First MD Initiated Contact with Patient 09/09/12 1400      Chief Complaint  Patient presents with  . Fall    (Consider location/radiation/quality/duration/timing/severity/associated sxs/prior treatment) HPI Comments: Patient presents with a head injury after a fall. He states he has Parkinson's and occasionally gets off balance. He was sitting in a kitchen note and lost his balance and fell forward he has had on the wall. There is no loss of consciousness. He has swelling and tenderness over the right side of his forehead. He has a mild overlying abrasion. He states he hit his right elbow but denies any ongoing pain to his elbow. He denies any other injuries from the fall. He's not currently on any anticoagulants. He does have a history of a subdural hematoma subarachnoid hemorrhage in the past fall.  Patient is a 79 y.o. male presenting with fall.  Fall Associated symptoms include headaches. Pertinent negatives include no chest pain, no abdominal pain and no shortness of breath.    Past Medical History  Diagnosis Date  . Parkinson's disease   . Hypertension   . Essential hypertension, benign 07/23/2012  . Other and unspecified hyperlipidemia 07/23/2012  . Prostate cancer 07/23/2012  . Recurrent falls 07/23/2012  . Hx of subdural hematoma 07/23/2012  . H/O subarachnoid hemorrhage 07/23/2012  . Parkinson's disease 07/23/2012  . Memory loss 07/23/2012    Past Surgical History  Procedure Laterality Date  . Abdominal surgery    . Back surgery    . Appendectomy      History reviewed. No pertinent family history.  History  Substance Use Topics  . Smoking status: Former Games developer  . Smokeless tobacco: Never Used  . Alcohol Use: 0.6 oz/week    1 Cans of beer per week     Comment: occasionally       Review of Systems  Constitutional: Negative for fever, chills, diaphoresis and fatigue.  HENT: Negative for congestion,  rhinorrhea and sneezing.   Eyes: Negative.   Respiratory: Negative for cough, chest tightness and shortness of breath.   Cardiovascular: Negative for chest pain and leg swelling.  Gastrointestinal: Negative for nausea, vomiting, abdominal pain, diarrhea and blood in stool.  Genitourinary: Negative for frequency, hematuria, flank pain and difficulty urinating.  Musculoskeletal: Negative for back pain and arthralgias.  Skin: Negative for rash.  Neurological: Positive for headaches. Negative for dizziness, speech difficulty, weakness and numbness.    Allergies  Penicillins and Sulfa antibiotics  Home Medications   Current Outpatient Rx  Name  Route  Sig  Dispense  Refill  . benazepril (LOTENSIN) 10 MG tablet   Oral   Take 1 tablet (10 mg total) by mouth daily.   90 tablet   3   . busPIRone (BUSPAR) 15 MG tablet   Oral   Take 15 mg by mouth daily as needed (for anxiety).         . carbidopa-levodopa (SINEMET IR) 25-100 MG per tablet   Oral   Take 1 tablet by mouth 3 (three) times daily. Take one tablet in the morning, 1 tablet at lunch, and half a tablet at bedtime         . cholecalciferol (VITAMIN D) 1000 UNITS tablet   Oral   Take 1,000 Units by mouth daily.         . finasteride (PROSCAR) 5 MG tablet   Oral   Take 5 mg by  mouth daily.         . vitamin C (ASCORBIC ACID) 500 MG tablet   Oral   Take 500 mg by mouth daily.           BP 172/86  Pulse 58  Temp(Src) 98.4 F (36.9 C) (Oral)  Resp 17  Ht 5\' 9"  (1.753 m)  Wt 150 lb (68.04 kg)  BMI 22.14 kg/m2  SpO2 97%  Physical Exam  Constitutional: He is oriented to person, place, and time. He appears well-developed and well-nourished.  HENT:  Head: Normocephalic.  Patient has a large right frontal hematoma with a small overlying abrasion. There is no underlying bony tenderness.  Eyes: Pupils are equal, round, and reactive to light.  Neck: Normal range of motion. Neck supple.  There's no pain along  the spine  Cardiovascular: Normal rate, regular rhythm and normal heart sounds.   Pulmonary/Chest: Effort normal and breath sounds normal. No respiratory distress. He has no wheezes. He has no rales. He exhibits no tenderness.  Abdominal: Soft. Bowel sounds are normal. There is no tenderness. There is no rebound and no guarding.  Musculoskeletal: Normal range of motion. He exhibits no edema.  Patient has no pain on palpation or range of motion of the extremities  Lymphadenopathy:    He has no cervical adenopathy.  Neurological: He is alert and oriented to person, place, and time. He has normal strength. No cranial nerve deficit or sensory deficit. GCS eye subscore is 4. GCS verbal subscore is 5. GCS motor subscore is 6.  Skin: Skin is warm and dry. No rash noted.  Psychiatric: He has a normal mood and affect.    ED Course  Procedures (including critical care time)  Results for orders placed during the hospital encounter of 02/02/10  BASIC METABOLIC PANEL      Result Value Range   Sodium 138  135 - 145 mEq/L   Potassium 3.9  3.5 - 5.1 mEq/L   Chloride 109  96 - 112 mEq/L   CO2 23  19 - 32 mEq/L   Glucose, Bld 115 (*) 70 - 99 mg/dL   BUN 22  6 - 23 mg/dL   Creatinine, Ser 7.82  0.4 - 1.5 mg/dL   Calcium 8.8  8.4 - 95.6 mg/dL   GFR calc non Af Amer >60  >60 mL/min   GFR calc Af Amer    >60 mL/min   Value: >60            The eGFR has been calculated     using the MDRD equation.     This calculation has not been     validated in all clinical     situations.     eGFR's persistently     <60 mL/min signify     possible Chronic Kidney Disease.  CBC      Result Value Range   WBC 7.8  4.0 - 10.5 K/uL   RBC 4.21 (*) 4.22 - 5.81 MIL/uL   Hemoglobin 12.8 (*) 13.0 - 17.0 g/dL   HCT 21.3 (*) 08.6 - 57.8 %   MCV 89.3  78.0 - 100.0 fL   MCH 30.5  26.0 - 34.0 pg   MCHC 34.1  30.0 - 36.0 g/dL   RDW 46.9  62.9 - 52.8 %   Platelets 146 (*) 150 - 400 K/uL  DIFFERENTIAL      Result Value  Range   Neutrophils Relative % 78 (*) 43 - 77 %  Neutro Abs 6.1  1.7 - 7.7 K/uL   Lymphocytes Relative 14  12 - 46 %   Lymphs Abs 1.0  0.7 - 4.0 K/uL   Monocytes Relative 6  3 - 12 %   Monocytes Absolute 0.5  0.1 - 1.0 K/uL   Eosinophils Relative 2  0 - 5 %   Eosinophils Absolute 0.1  0.0 - 0.7 K/uL   Basophils Relative 1  0 - 1 %   Basophils Absolute 0.0  0.0 - 0.1 K/uL   Ct Head Wo Contrast  09/09/2012   *RADIOLOGY REPORT*  Clinical Data:  Larey Seat.  Hit head.  CT HEAD WITHOUT CONTRAST CT CERVICAL SPINE WITHOUT CONTRAST  Technique:  Multidetector CT imaging of the head and cervical spine was performed following the standard protocol without intravenous contrast.  Multiplanar CT image reconstructions of the cervical spine were also generated.  Comparison:  10/11/2011.  CT HEAD  Findings: Right to frontal scalp hematoma without underlying skull fracture.  No intracranial hemorrhages identified.  No hemorrhagic extra-axial fluid collections.  The brain is otherwise normal and stable for age.  The brainstem and cerebellum are grossly normal and stable.  IMPRESSION: No acute intracranial findings or acute skull fracture. Right frontal scalp hematoma.  CT CERVICAL SPINE  Findings: Normal alignment of the cervical vertebral bodies.  Mild degenerative changes for age.  No acute fracture.  No abnormal prevertebral soft tissue swelling.  The skull base C1 and C1-2 articulations are maintained.  The lung apices are clear.  IMPRESSION: Mild degenerative cervical spondylosis. No acute cervical spine fracture   Original Report Authenticated By: Rudie Meyer, M.D.   Ct Cervical Spine Wo Contrast  09/09/2012   *RADIOLOGY REPORT*  Clinical Data:  Larey Seat.  Hit head.  CT HEAD WITHOUT CONTRAST CT CERVICAL SPINE WITHOUT CONTRAST  Technique:  Multidetector CT imaging of the head and cervical spine was performed following the standard protocol without intravenous contrast.  Multiplanar CT image reconstructions of the cervical  spine were also generated.  Comparison:  10/11/2011.  CT HEAD  Findings: Right to frontal scalp hematoma without underlying skull fracture.  No intracranial hemorrhages identified.  No hemorrhagic extra-axial fluid collections.  The brain is otherwise normal and stable for age.  The brainstem and cerebellum are grossly normal and stable.  IMPRESSION: No acute intracranial findings or acute skull fracture. Right frontal scalp hematoma.  CT CERVICAL SPINE  Findings: Normal alignment of the cervical vertebral bodies.  Mild degenerative changes for age.  No acute fracture.  No abnormal prevertebral soft tissue swelling.  The skull base C1 and C1-2 articulations are maintained.  The lung apices are clear.  IMPRESSION: Mild degenerative cervical spondylosis. No acute cervical spine fracture   Original Report Authenticated By: Rudie Meyer, M.D.      1. Scalp hematoma, initial encounter       MDM  Patient discharged with head injury precautions. Is alert oriented acting appropriately. He has had no vomiting. His wife thinks his tetanus shot is up-to-date. He was advised to return for any worsening symptoms or signs of head injury.        Rolan Bucco, MD 09/09/12 1539

## 2012-11-22 ENCOUNTER — Ambulatory Visit: Payer: Medicare Other | Admitting: Neurology

## 2012-12-03 ENCOUNTER — Encounter: Payer: Self-pay | Admitting: Neurology

## 2012-12-03 ENCOUNTER — Ambulatory Visit (INDEPENDENT_AMBULATORY_CARE_PROVIDER_SITE_OTHER): Payer: Medicare Other | Admitting: Neurology

## 2012-12-03 VITALS — BP 147/63 | HR 54

## 2012-12-03 DIAGNOSIS — F329 Major depressive disorder, single episode, unspecified: Secondary | ICD-10-CM

## 2012-12-03 DIAGNOSIS — G20 Parkinson's disease: Secondary | ICD-10-CM

## 2012-12-03 DIAGNOSIS — Z9181 History of falling: Secondary | ICD-10-CM

## 2012-12-03 DIAGNOSIS — R413 Other amnesia: Secondary | ICD-10-CM

## 2012-12-03 DIAGNOSIS — F411 Generalized anxiety disorder: Secondary | ICD-10-CM

## 2012-12-03 DIAGNOSIS — R296 Repeated falls: Secondary | ICD-10-CM

## 2012-12-03 DIAGNOSIS — G20A1 Parkinson's disease without dyskinesia, without mention of fluctuations: Secondary | ICD-10-CM

## 2012-12-03 DIAGNOSIS — F3289 Other specified depressive episodes: Secondary | ICD-10-CM

## 2012-12-03 DIAGNOSIS — Z8679 Personal history of other diseases of the circulatory system: Secondary | ICD-10-CM

## 2012-12-03 MED ORDER — ESCITALOPRAM OXALATE 5 MG PO TABS: 5.0000 mg | ORAL_TABLET | Freq: Every day | ORAL | Status: AC

## 2012-12-03 NOTE — Progress Notes (Signed)
Subjective:    Patient ID: Jermaine Lee is a 79 y.o. male.  HPI  Interim history:   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  I first met him on 07/23/12, at which I suggested that he take Sinemet one pill 3 times a day. He is a former patient of Dr. Imagene Gurney and was last seen by him on 04/19/2012. I suggested a 4 month followup but he presented on 08/27/12 for a sooner than scheduled appointment because of increase in restlessness. His wife decreased his Sinemet back to half a pill at the last dose so he continued to take one pill in the morning, one at midday and half a pill at night and she felt that he was doing better with that regimen.  His wife has 6-8 h of help per day. He has a son in [place] and another son in [place] and a daughter in [place]. He has [facility] daily. He has no new complaints and his wife reports no major changes.   He was diagnosed with Parkinson's disease in February 2005 and initially treated with dopaminergic agonists. He fell in October 2009 and sustained a subarachnoid as well as a subdural hematoma. He walks with a cane or walker and also has a wheelchair. He has been having hallucinations and daytime somnolence. He was tapered off of selegiline in the past. He also has had vertigo. He has had bladder incontinence. In November 2011 he fell and broke his nose. He has had some memory loss. In November 2012 his MMSE was 26, clock drawing was 3, animal fluency was 13. In February 2013 his MMSE was 24, clock drawing was 2, animal fluency was 12. He has had some orthostatic hypotension and recurrent freezing episodes. In June 2013 he fell and sustained a right parietal occipital scalp laceration requiring stitches. CT head showed no acute abnormalities. In June 2013 his MMSE was  20, clock drawing was 2, animal fluency was 13. He had physical therapy July through September last year. In January 2014 his MMSE was 23, clock drawing was 4, animal fluency was 13.  At the time of his visit on 08/27/2012 I suggested trial of BuSpar 15 mg, for his anxiety and restlessness, once daily prn. His exam was stable at the time. I suggested that they decrease BuSpar to 1/2 pill once daily as needed, if he became too sedated. In the interim he fell in July and was seen at the emergency room. He had ahead CT of the cervical spine CT both of which did not show any acute injuries. He did have a scalp hematoma in the front. She reports that using BuSpar as needed has not been very helpful. At least she has not been able to notice any difference in his restlessness and anxiety. She also noticed that he is less motivated to do things and seems to be less interactive. He denies depression and initially upon asking but does endorse that sometimes he does feel a little depressed. He has never been on an antidepressant. He sometimes walks without the walker. I have encouraged him to use his walker at all times. His wife tries to remind him that sometimes he just does not remember using his walker. Sometimes he pulls the walker behind him as he walks.   His Past Medical History Is Significant  For: Past Medical History  Diagnosis Date  . Parkinson's disease   . Hypertension   . Essential hypertension, benign 07/23/2012  . Other and unspecified hyperlipidemia 07/23/2012  . Prostate cancer 07/23/2012  . Recurrent falls 07/23/2012  . Hx of subdural hematoma 07/23/2012  . H/O subarachnoid hemorrhage 07/23/2012  . Parkinson's disease 07/23/2012  . Memory loss 07/23/2012    His Past Surgical History Is Significant For: Past Surgical History  Procedure Laterality Date  . Abdominal surgery    . Back surgery    . Appendectomy      His Family History Is Significant For: No family history on file.  His Social  History Is Significant For: History   Social History  . Marital Status: Married    Spouse Name: N/A    Number of Children: N/A  . Years of Education: N/A   Social History Main Topics  . Smoking status: Former Games developer  . Smokeless tobacco: Never Used  . Alcohol Use: 0.6 oz/week    1 Cans of beer per week     Comment: occasionally   . Drug Use: No  . Sexual Activity: None   Other Topics Concern  . None   Social History Narrative  . None    His Allergies Are:  Allergies  Allergen Reactions  . Penicillins Hives  . Sulfa Antibiotics Hives  :   His Current Medications Are:  Outpatient Encounter Prescriptions as of 12/03/2012  Medication Sig Dispense Refill  . benazepril (LOTENSIN) 10 MG tablet Take 1 tablet (10 mg total) by mouth daily.  90 tablet  3  . busPIRone (BUSPAR) 15 MG tablet Take 15 mg by mouth daily as needed (for anxiety).      . carbidopa-levodopa (SINEMET IR) 25-100 MG per tablet Take 1 tablet by mouth 3 (three) times daily. Take one tablet in the morning, 1 tablet at lunch, and half a tablet at bedtime      . cholecalciferol (VITAMIN D) 1000 UNITS tablet Take 1,000 Units by mouth daily.      . finasteride (PROSCAR) 5 MG tablet Take 5 mg by mouth daily.      . vitamin C (ASCORBIC ACID) 500 MG tablet Take 500 mg by mouth daily.       No facility-administered encounter medications on file as of 12/03/2012.  : Review of Systems  Respiratory: Positive for cough.   Gastrointestinal:       Incontinent   Genitourinary:       Incontinence   Allergic/Immunologic:       Runny Nose  Neurological:       Confusion  Psychiatric/Behavioral: Positive for hallucinations.       Disinterest in activites    Objective:  Neurologic Exam  Physical Exam Physical Examination:   Filed Vitals:   12/03/12 1550  BP: 147/63  Pulse: 54    General Examination: The patient is a very pleasant 79 y.o. male in no acute distress.  HEENT: Normocephalic, atraumatic, pupils are  equal, round and reactive to light and accommodation. Funduscopic exam is normal with sharp disc margins noted. Extraocular tracking shows mild saccadic breakdown without nystagmus noted. There is no limitation to his gaze. There is mild decrease in eye blink rate. Hearing is impaired. Tympanic membranes are clear bilaterally. Face is symmetric with mild facial masking and normal facial sensation. There is no lip, neck or jaw tremor. Neck is moderately rigid with intact passive ROM. There are no carotid bruits on auscultation. Oropharynx exam reveals mild  mouth dryness. No significant airway crowding is noted. Mallampati is class II. Tongue protrudes centrally and palate elevates symmetrically.    Chest: is clear to auscultation without wheezing, rhonchi or crackles noted.  Heart: sounds are regular and normal without murmurs, rubs or gallops noted.   Abdomen: is soft, non-tender and non-distended with normal bowel sounds appreciated on auscultation.  Extremities: There is no pitting edema in the distal lower extremities bilaterally.   Skin: is warm and dry with no trophic changes noted.  Musculoskeletal: exam reveals no obvious joint deformities, tenderness or joint swelling or erythema.  Neurologically:  Mental status: The patient is awake and alert, paying good attention. He is able to to partially provide the history. His wife provides details. He is oriented to: person, place, time/date, situation, day of week, month of year and year. His memory, attention, language and knowledge are impaired. There is no aphasia, agnosia, apraxia or anomia. There is a mild degree of bradyphrenia. Speech is mildly hypophonic with moderate dysarthria noted. Mood is congruent and affect is blunted.  His Fall Assessment Tool Score is 13 today.   Cranial nerves are as described above under HEENT exam. In addition, shoulder shrug is normal with equal shoulder height noted.  Motor exam: Normal bulk, and strength  for age is noted. Tone is mildly rigid with absence of cogwheeling in the bilateral extremities. There is overall moderate bradykinesia. There is no drift or rebound. There is no resting tremor.  Reflexes are trace in the upper extremities and none in the lower extremities. Fine motor skills exam reveals: Finger taps are mildly impaired on the right and mildly impaired on the left. Hand movements are mildly impaired on the right and mildly impaired on the left. RAP (rapid alternating patting) is mildly impaired on the right and mildly impaired on the left. Foot taps are moderately impaired on the right and moderately impaired on the left. Foot agility (in the form of heel stomping) is moderately impaired on the right and moderately impaired on the left.    Cerebellar testing shows no dysmetria or intention tremor on finger to nose testing. Heel to shin is unremarkable bilaterally. There is no truncal or gait ataxia.   Sensory exam is intact to light touch, pinprick, vibration, temperature sense and proprioception in the upper and lower extremities.   Gait, station and balance exam: I did not have him stand or walk for me today. He is situated in his [facility] and did not bring his 2 wheeled walker. He leans to the right while sitting in his wheelchair and his neck is slightly tilted to the right.   Assessment and Plan:   In summary, Joedy Eickhoff is a very pleasant 79 y.o.-year old male with a history of Parkinson's disease, complicated by memory loss, hallucinations, gait dysfunction with freezing and postural instability. His exam is stable. He has had more loss of appetite, loss of interest, loss of motivation, more restlessness and prn use of Buspar 15 mg has not been helpful. I would like for him to take BuSpar 15 mg strength once daily on a regular basis. I suggested taking it at 10 AM. Furthermore, after about 2 weeks of being stable on BuSpar, I would like to try him on lexapro 5 mg daily for his  mood issues, including anxiety. I explained potential side effects to them and also pointed out that we have to use this medicine with caution in the elderly. I would like to keep his  sinemet the same. I answered all their questions today and the patient and his wife were in agreement with the above outlined plan. I would like to see the patient back in 4 months, sooner if the need arises and encouraged them to call with any interim questions, concerns, problems or updates and refill requests.

## 2012-12-03 NOTE — Patient Instructions (Addendum)
I think overall you are doing fairly well but I do want to suggest a few things today:  Remember to drink plenty of fluid, eat healthy meals and do not skip any meals. Try to eat protein with a every meal and eat a healthy snack such as fruit or nuts in between meals. Try to keep a regular sleep-wake schedule and try to exercise daily, particularly in the form of walking, 10-20 minutes a day, if you can.   Use your walker at all time.   Engage in social activities in your community and with your family and try to keep up with current events by reading the newspaper or watching the news.   As far as your medications are concerned, I would like to suggest taking Buspar 15 mg once daily at 10 AM. After 2 weeks, 12/16/12, you will start the new antidepressant, Lexapro 5 mg strength, once daily with breakfast. Everything else will stay the same.    As far as diagnostic testing: no new test needed at this time.   I would like to see you back in 4 months, sooner if we need to. Please call us with any interim questions, concerns, problems, updates or refill requests.  Brett Canales is my clinical assistant and will answer any of your questions and relay your messages to me and also relay most of my messages to you.  Our phone number is [phone]. We also have an after hours call service for urgent matters and there is a physician on-call for urgent questions. For any emergencies you know to call 911 or go to the nearest emergency room.

## 2012-12-18 ENCOUNTER — Other Ambulatory Visit: Payer: Self-pay

## 2012-12-18 MED ORDER — CARBIDOPA-LEVODOPA 25-100 MG PO TABS: ORAL_TABLET | ORAL | Status: DC

## 2013-01-31 ENCOUNTER — Telehealth: Payer: Self-pay | Admitting: Neurology

## 2013-01-31 DIAGNOSIS — F322 Major depressive disorder, single episode, severe without psychotic features: Secondary | ICD-10-CM

## 2013-01-31 DIAGNOSIS — F411 Generalized anxiety disorder: Secondary | ICD-10-CM

## 2013-01-31 DIAGNOSIS — G20 Parkinson's disease: Secondary | ICD-10-CM

## 2013-01-31 NOTE — Telephone Encounter (Signed)
Returned call. No answer.  

## 2013-01-31 NOTE — Telephone Encounter (Signed)
I prescribed medication for depression and anxiety. All of these medications have similar potential side effects. At this juncture, I think it is best if we get an opinion from a geriatric psychiatrist. To that end, I have placed an order for a referral to geriatric psychiatry. Please advise patient's wife.

## 2013-01-31 NOTE — Telephone Encounter (Signed)
Spoke with  Wife and she said that patient is not taking  the escitalopram-5 mg that was prescribed, she is so afraid to give to patient because of all the side effects that she has read. Patient is so restless, wants to know what else can they do.

## 2013-04-10 ENCOUNTER — Other Ambulatory Visit: Payer: Self-pay | Admitting: Neurology

## 2013-04-14 ENCOUNTER — Inpatient Hospital Stay (HOSPITAL_COMMUNITY)
Admission: EM | Admit: 2013-04-14 | Discharge: 2013-04-21 | DRG: 069 | Disposition: A | Discharge status: Skilled Nursing Facility | Payer: Medicare Other | Attending: Internal Medicine | Admitting: Internal Medicine

## 2013-04-14 ENCOUNTER — Emergency Department (HOSPITAL_COMMUNITY): Payer: Medicare Other

## 2013-04-14 ENCOUNTER — Encounter (HOSPITAL_COMMUNITY): Payer: Self-pay | Admitting: Emergency Medicine

## 2013-04-14 DIAGNOSIS — Z8546 Personal history of malignant neoplasm of prostate: Secondary | ICD-10-CM

## 2013-04-14 DIAGNOSIS — Z87891 Personal history of nicotine dependence: Secondary | ICD-10-CM

## 2013-04-14 DIAGNOSIS — Z515 Encounter for palliative care: Secondary | ICD-10-CM

## 2013-04-14 DIAGNOSIS — G20 Parkinson's disease: Secondary | ICD-10-CM

## 2013-04-14 DIAGNOSIS — I498 Other specified cardiac arrhythmias: Secondary | ICD-10-CM | POA: Diagnosis present

## 2013-04-14 DIAGNOSIS — G9340 Encephalopathy, unspecified: Secondary | ICD-10-CM | POA: Diagnosis present

## 2013-04-14 DIAGNOSIS — Z79899 Other long term (current) drug therapy: Secondary | ICD-10-CM

## 2013-04-14 DIAGNOSIS — Z8679 Personal history of other diseases of the circulatory system: Secondary | ICD-10-CM

## 2013-04-14 DIAGNOSIS — I10 Essential (primary) hypertension: Secondary | ICD-10-CM | POA: Diagnosis present

## 2013-04-14 DIAGNOSIS — R296 Repeated falls: Secondary | ICD-10-CM

## 2013-04-14 DIAGNOSIS — R5381 Other malaise: Secondary | ICD-10-CM | POA: Diagnosis present

## 2013-04-14 DIAGNOSIS — G459 Transient cerebral ischemic attack, unspecified: Principal | ICD-10-CM | POA: Diagnosis present

## 2013-04-14 DIAGNOSIS — E782 Mixed hyperlipidemia: Secondary | ICD-10-CM | POA: Diagnosis present

## 2013-04-14 DIAGNOSIS — R1310 Dysphagia, unspecified: Secondary | ICD-10-CM

## 2013-04-14 DIAGNOSIS — R531 Weakness: Secondary | ICD-10-CM

## 2013-04-14 DIAGNOSIS — G20A1 Parkinson's disease without dyskinesia, without mention of fluctuations: Secondary | ICD-10-CM | POA: Diagnosis present

## 2013-04-14 DIAGNOSIS — F0390 Unspecified dementia without behavioral disturbance: Secondary | ICD-10-CM | POA: Diagnosis present

## 2013-04-14 DIAGNOSIS — N400 Enlarged prostate without lower urinary tract symptoms: Secondary | ICD-10-CM | POA: Diagnosis present

## 2013-04-14 DIAGNOSIS — I429 Cardiomyopathy, unspecified: Secondary | ICD-10-CM

## 2013-04-14 DIAGNOSIS — R471 Dysarthria and anarthria: Secondary | ICD-10-CM | POA: Diagnosis present

## 2013-04-14 DIAGNOSIS — N390 Urinary tract infection, site not specified: Secondary | ICD-10-CM | POA: Diagnosis present

## 2013-04-14 DIAGNOSIS — I5043 Acute on chronic combined systolic (congestive) and diastolic (congestive) heart failure: Secondary | ICD-10-CM | POA: Diagnosis not present

## 2013-04-14 HISTORY — DX: Gastro-esophageal reflux disease without esophagitis: K21.9

## 2013-04-14 HISTORY — DX: Mixed hyperlipidemia: E78.2

## 2013-04-14 HISTORY — DX: Orthostatic hypotension: I95.1

## 2013-04-14 LAB — CBC WITH DIFFERENTIAL/PLATELET
Basophils Absolute: 0 10*3/uL (ref 0.0–0.1)
Basophils Relative: 0 % (ref 0–1)
Eosinophils Absolute: 0.1 10*3/uL (ref 0.0–0.7)
Eosinophils Relative: 2 % (ref 0–5)
HCT: 35.8 % — ABNORMAL LOW (ref 39.0–52.0)
Lymphocytes Relative: 20 % (ref 12–46)
MCH: 29.7 pg (ref 26.0–34.0)
MCHC: 32.1 g/dL (ref 30.0–36.0)
MCV: 92.5 fL (ref 78.0–100.0)
Monocytes Absolute: 0.6 10*3/uL (ref 0.1–1.0)
Platelets: 143 10*3/uL — ABNORMAL LOW (ref 150–400)
RDW: 13.7 % (ref 11.5–15.5)
WBC: 5.6 10*3/uL (ref 4.0–10.5)

## 2013-04-14 LAB — BASIC METABOLIC PANEL
BUN: 32 mg/dL — ABNORMAL HIGH (ref 6–23)
Calcium: 8.8 mg/dL (ref 8.4–10.5)
Chloride: 106 mEq/L (ref 96–112)
Creatinine, Ser: 1.01 mg/dL (ref 0.50–1.35)
GFR calc Af Amer: 74 mL/min — ABNORMAL LOW (ref >90)
Sodium: 143 mEq/L (ref 135–145)

## 2013-04-14 LAB — URINE MICROSCOPIC-ADD ON

## 2013-04-14 LAB — URINALYSIS, ROUTINE W REFLEX MICROSCOPIC
Ketones, ur: 15 mg/dL — AB
Nitrite: NEGATIVE
Protein, ur: NEGATIVE mg/dL

## 2013-04-14 MED ORDER — ASPIRIN EC 325 MG PO TBEC: 325.0000 mg | DELAYED_RELEASE_TABLET | Freq: Once | ORAL | Status: DC

## 2013-04-14 NOTE — Consult Note (Signed)
Referring Physician: Dr. Wilkie Aye    Chief Complaint: Recurrent spells of transient garbled speech.  HPI: Jermaine Lee is an 79 y.o. male history Parkinson's disease, hypertension, traumatic subarachnoid and subdural hemorrhage and memory loss, brought to the emergency room by family following multiple episodes of transient slurred speech for the past couple days. Initial symptoms occurred on 04/12/2013. No focal weakness was noted including no facial droop. Symptoms resolved in each instance. His last episode occurred this afternoon but resolved after arriving in the emergency room. CT scan of his head showed no acute intracranial abnormality. Patient has not been on antiplatelet therapy. NIH stroke score was 4.  LSN: 04/14/2013, unclear time. tPA Given: No: Rapidly resolving deficits. MRankin:   Past Medical History  Diagnosis Date  . Parkinson's disease   . Hypertension   . Essential hypertension, benign 07/23/2012  . Other and unspecified hyperlipidemia 07/23/2012  . Prostate cancer 07/23/2012  . Recurrent falls 07/23/2012  . Hx of subdural hematoma 07/23/2012  . H/O subarachnoid hemorrhage 07/23/2012  . Parkinson's disease 07/23/2012  . Memory loss 07/23/2012    No family history on file.   Medications: I have reviewed the patient's current medications.  ROS: History obtained from spouse  General ROS: negative for - chills, fatigue, fever, night sweats, weight gain or weight loss Psychological ROS: negative for - behavioral disorder, hallucinations, memory difficulties, mood swings or suicidal ideation Ophthalmic ROS: negative for - blurry vision, double vision, eye pain or loss of vision ENT ROS: negative for - epistaxis, nasal discharge, oral lesions, sore throat, tinnitus or vertigo Allergy and Immunology ROS: negative for - hives or itchy/watery eyes Hematological and Lymphatic ROS: negative for - bleeding problems, bruising or swollen lymph nodes Endocrine ROS: negative for -  galactorrhea, hair pattern changes, polydipsia/polyuria or temperature intolerance Respiratory ROS: negative for - cough, hemoptysis, shortness of breath or wheezing Cardiovascular ROS: negative for - chest pain, dyspnea on exertion, edema or irregular heartbeat Gastrointestinal ROS: negative for - abdominal pain, diarrhea, hematemesis, nausea/vomiting or stool incontinence Genito-Urinary ROS: negative for - dysuria, hematuria, incontinence or urinary frequency/urgency Musculoskeletal ROS: negative for - joint swelling or muscular weakness Neurological ROS: as noted in HPI Dermatological ROS: negative for rash and skin lesion changes  Physical Examination: Blood pressure 152/62, pulse 55, resp. rate 16, SpO2 97.00%.  Neurologic Examination: Mental Status: Somnolent but arousable, disoriented to time and place.  Low-volume dysarthric speech without evidence of aphasia. Able to follow commands simple commands. Cranial Nerves: II-Visual fields were normal to visual threat. III/IV/VI-Pupils were equal and reacted. Extraocular movements were full and conjugate.    V/VII-no facial numbness and no facial weakness. VIII-normal. X-dysarthric low-volume speech. Motor: 5/5 bilaterally with normal tone and bulk Sensory: Difficult to assess Deep Tendon Reflexes: 1+ and symmetric. Plantars: Flexor bilaterally Cerebellar: Unable to assess.  Dg Chest 2 View  04/14/2013   CLINICAL DATA:  Fatigue  EXAM: CHEST  2 VIEW  COMPARISON:  10/11/2011; 02/05/2008  FINDINGS: Grossly unchanged cardiac silhouette and mediastinal contours with tortuosity and possible mild ectasia of the thoracic aorta. Atherosclerotic plaque within the thoracic aorta. No focal airspace opacities. No pleural effusion or pneumothorax. No definite evidence of edema. No acute osseus abnormalities.  IMPRESSION: No acute cardiopulmonary disease.   Electronically Signed   By: Simonne Come M.D.   On: 04/14/2013 20:30   Ct Head Wo  Contrast  04/14/2013   CLINICAL DATA:  EMS reports family st's pt had garbled speech Sat and Sun but  resolved. St's he had garbled speech earlier today as well. Also st's pt has shuffling gait and sleeping more than usual.  EXAM: CT HEAD WITHOUT CONTRAST  TECHNIQUE: Contiguous axial images were obtained from the base of the skull through the vertex without intravenous contrast.  COMPARISON:  09/09/2012  FINDINGS: There is no evidence of mass effect, midline shift, or extra-axial fluid collections. There is no evidence of a space-occupying lesion or intracranial hemorrhage. There is no evidence of a cortical-based area of acute infarction. There is generalized cerebral atrophy. There is periventricular white matter low attenuation likely secondary to microangiopathy.  The ventricles and sulci are appropriate for the patient's age. The basal cisterns are patent.  Visualized portions of the orbits are unremarkable. There is bilateral sphenoid sinus and right maxillary sinus mucosal thickening. There is mild ethmoid sinus mucosal thickening. There is a left sphenoid sinus air-fluid level. Cerebrovascular atherosclerotic calcifications are noted.  The osseous structures are unremarkable.  IMPRESSION: No acute intracranial pathology.   Electronically Signed   By: Elige Ko   On: 04/14/2013 18:34    Assessment: 79 y.o. male 79 year old man presenting with possible TIAs manifested by marked worsening in dysarthria transiently.  Stroke Risk Factors - hypertension  Plan: 1. HgbA1c, fasting lipid panel 2. MRI, MRA  of the brain without contrast 3. PT consult, OT consult, Speech consult 4. Echocardiogram 5. Carotid dopplers 6. Prophylactic therapy-Antiplatelet med: Aspirin 325 mg by mouth for 300 mg rectal suppository daily 7. Risk factor modification 8. Telemetry monitoring     C.R. Roseanne Reno, MD [facility]  04/14/2013, 9:37 PM

## 2013-04-14 NOTE — ED Notes (Signed)
EMS reports family st's pt had garbled speech Sat and Sun but resolved.  St's he had garbled speech earlier today as well.  Also st's pt has shuffling gait and sleeping more than usual.

## 2013-04-14 NOTE — ED Notes (Signed)
Pt unable to urinate at this time.  Will attempt again in 10 mins.

## 2013-04-14 NOTE — ED Provider Notes (Signed)
CSN: [id number]     Arrival date & time 04/14/13  1736 History   First MD Initiated Contact with Patient 04/14/13 1737     Chief Complaint  Patient presents with  . Fatigue   HPI  79 y/o male with history as noted below who presents via EMS with concerns for slurred and garbled speech. Per EMS, the patient had intermittent episodes of garbled speech Saturday and Sunday. His symptoms resolved. Earlier today the patient had an episode of garbled speech. Per EMS, Family states the patient has had a worsening of his shuffling gait and was unable to ambulate with his walker and that he has been sleeping more than usual.   Past Medical History  Diagnosis Date  . Parkinson's disease   . Hypertension   . Essential hypertension, benign 07/23/2012  . Other and unspecified hyperlipidemia 07/23/2012  . Prostate cancer 07/23/2012  . Recurrent falls 07/23/2012  . Hx of subdural hematoma 07/23/2012  . H/O subarachnoid hemorrhage 07/23/2012  . Parkinson's disease 07/23/2012  . Memory loss 07/23/2012   Past Surgical History  Procedure Laterality Date  . Abdominal surgery    . Back surgery    . Appendectomy     No family history on file. History  Substance Use Topics  . Smoking status: Former Games developer  . Smokeless tobacco: Never Used  . Alcohol Use: 0.6 oz/week    1 Cans of beer per week     Comment: occasionally     Review of Systems  Constitutional: Negative for fever and chills.  Respiratory: Negative for shortness of breath.   Cardiovascular: Negative for chest pain.  Gastrointestinal: Negative for nausea, vomiting and abdominal pain.  Genitourinary: Negative for dysuria and frequency.  Neurological: Positive for speech difficulty. Negative for weakness, numbness and headaches.  All other systems reviewed and are negative.   Allergies  Penicillins and Sulfa antibiotics  Home Medications   Current Outpatient Rx  Name  Route  Sig  Dispense  Refill  . benazepril (LOTENSIN) 10 MG tablet    Oral   Take 1 tablet (10 mg total) by mouth daily.   90 tablet   3   . busPIRone (BUSPAR) 15 MG tablet   Oral   Take 15 mg by mouth daily as needed (for anxiety).         . carbidopa-levodopa (SINEMET IR) 25-100 MG per tablet      TAKE 1 AND 1/2 AT 7AM ON MON.WED.FRI.1 TAB TUES.THURS.SAT.SUN.1 AT NOON DAILY.1/2 AT BEDTIME DAILY   135 tablet   3   . cholecalciferol (VITAMIN D) 1000 UNITS tablet   Oral   Take 1,000 Units by mouth daily.         Marland Kitchen escitalopram (LEXAPRO) 5 MG tablet   Oral   Take 1 tablet (5 mg total) by mouth daily with breakfast.   30 tablet   5   . finasteride (PROSCAR) 5 MG tablet   Oral   Take 5 mg by mouth daily.         . vitamin C (ASCORBIC ACID) 500 MG tablet   Oral   Take 500 mg by mouth daily.          BP 126/53  Pulse 48  Temp(Src) 98.6 F (37 C) (Oral)  Resp 20  SpO2 97% Physical Exam  Constitutional: He is oriented to person, place, and time. He appears well-developed and well-nourished. No distress.  HENT:  Head: Normocephalic and atraumatic.  Mouth/Throat: No oropharyngeal exudate.  Eyes:  Conjunctivae are normal. Pupils are equal, round, and reactive to light.  Neck: Normal range of motion. Neck supple.  Cardiovascular: Normal rate and normal heart sounds.  Exam reveals no gallop and no friction rub.   No murmur heard. Pulmonary/Chest: Effort normal and breath sounds normal.  Abdominal: Soft. He exhibits no distension. There is no tenderness.  Musculoskeletal: Normal range of motion. He exhibits no edema and no tenderness.  Neurological: He is alert and oriented to person, place, and time. He has normal strength and normal reflexes. No cranial nerve deficit or sensory deficit. Coordination normal. GCS eye subscore is 4. GCS verbal subscore is 5. GCS motor subscore is 6.  Mild bradykinesia. Leans to right in stretcher (but was noted on previous neurology note). Rapid alternating hand movements with mild impairment. No drift.  Strength and sensation intact. Gait deferred as no walker is with the patient.   Skin: Skin is warm and dry.    ED Course  Procedures (including critical care time) Labs Review Labs Reviewed  CBC WITH DIFFERENTIAL - Abnormal; Notable for the following:    RBC 3.87 (*)    Hemoglobin 11.5 (*)    HCT 35.8 (*)    Platelets 143 (*)    All other components within normal limits  BASIC METABOLIC PANEL - Abnormal; Notable for the following:    BUN 32 (*)    GFR calc non Af Amer 64 (*)    GFR calc Af Amer 74 (*)    All other components within normal limits  URINALYSIS, ROUTINE W REFLEX MICROSCOPIC - Abnormal; Notable for the following:    Color, Urine AMBER (*)    Bilirubin Urine SMALL (*)    Ketones, ur 15 (*)    Leukocytes, UA SMALL (*)    All other components within normal limits  URINE MICROSCOPIC-ADD ON - Abnormal; Notable for the following:    Casts HYALINE CASTS (*)    Crystals CA OXALATE CRYSTALS (*)    All other components within normal limits  URINE CULTURE   Imaging Review Dg Chest 2 View  04/14/2013   CLINICAL DATA:  Fatigue  EXAM: CHEST  2 VIEW  COMPARISON:  10/11/2011; 02/05/2008  FINDINGS: Grossly unchanged cardiac silhouette and mediastinal contours with tortuosity and possible mild ectasia of the thoracic aorta. Atherosclerotic plaque within the thoracic aorta. No focal airspace opacities. No pleural effusion or pneumothorax. No definite evidence of edema. No acute osseus abnormalities.  IMPRESSION: No acute cardiopulmonary disease.   Electronically Signed   By: Simonne Come M.D.   On: 04/14/2013 20:30   Ct Head Wo Contrast  04/14/2013   CLINICAL DATA:  EMS reports family st's pt had garbled speech Sat and Sun but resolved. St's he had garbled speech earlier today as well. Also st's pt has shuffling gait and sleeping more than usual.  EXAM: CT HEAD WITHOUT CONTRAST  TECHNIQUE: Contiguous axial images were obtained from the base of the skull through the vertex without  intravenous contrast.  COMPARISON:  09/09/2012  FINDINGS: There is no evidence of mass effect, midline shift, or extra-axial fluid collections. There is no evidence of a space-occupying lesion or intracranial hemorrhage. There is no evidence of a cortical-based area of acute infarction. There is generalized cerebral atrophy. There is periventricular white matter low attenuation likely secondary to microangiopathy.  The ventricles and sulci are appropriate for the patient's age. The basal cisterns are patent.  Visualized portions of the orbits are unremarkable. There is bilateral sphenoid sinus and  right maxillary sinus mucosal thickening. There is mild ethmoid sinus mucosal thickening. There is a left sphenoid sinus air-fluid level. Cerebrovascular atherosclerotic calcifications are noted.  The osseous structures are unremarkable.  IMPRESSION: No acute intracranial pathology.   Electronically Signed   By: Elige Ko   On: 04/14/2013 18:34    EKG Interpretation   None      MDM   79 y/o male with history as noted below who presents via EMS with concerns for slurred and garbled speech. Exam currently as baseline. Non-focal neurological exam. Afebrile with stable vital signs. CXR not c/w pneumonia. UA not c/w UTI. CT head normal. Concern for TIA as cause of the symptoms. The patient was evaluated by neurology in the ED who recommended TIA workup. The patient was admitted to the hospitalist service in HDS condition.   1. TIA (transient ischemic attack)   2. Essential hypertension, benign        Shanon Ace, MD 04/14/13 [phone]

## 2013-04-14 NOTE — ED Notes (Signed)
Admitting MD at bedside to assess pt for admission 

## 2013-04-15 ENCOUNTER — Inpatient Hospital Stay (HOSPITAL_COMMUNITY): Payer: Medicare Other

## 2013-04-15 ENCOUNTER — Ambulatory Visit: Payer: Medicare Other | Admitting: Neurology

## 2013-04-15 ENCOUNTER — Encounter (HOSPITAL_COMMUNITY): Payer: Self-pay | Admitting: General Practice

## 2013-04-15 DIAGNOSIS — Z8679 Personal history of other diseases of the circulatory system: Secondary | ICD-10-CM

## 2013-04-15 DIAGNOSIS — G459 Transient cerebral ischemic attack, unspecified: Principal | ICD-10-CM

## 2013-04-15 DIAGNOSIS — I10 Essential (primary) hypertension: Secondary | ICD-10-CM

## 2013-04-15 DIAGNOSIS — G20 Parkinson's disease: Secondary | ICD-10-CM

## 2013-04-15 DIAGNOSIS — G9340 Encephalopathy, unspecified: Secondary | ICD-10-CM | POA: Diagnosis present

## 2013-04-15 LAB — LIPID PANEL
Cholesterol: 152 mg/dL (ref 0–200)
LDL Cholesterol: 84 mg/dL (ref 0–99)
Total CHOL/HDL Ratio: 2.7 RATIO
Triglycerides: 53 mg/dL (ref <150)
VLDL: 11 mg/dL (ref 0–40)

## 2013-04-15 LAB — COMPREHENSIVE METABOLIC PANEL
BUN: 29 mg/dL — ABNORMAL HIGH (ref 6–23)
CO2: 25 mEq/L (ref 19–32)
Chloride: 104 mEq/L (ref 96–112)
Creatinine, Ser: 0.88 mg/dL (ref 0.50–1.35)
GFR calc Af Amer: 86 mL/min — ABNORMAL LOW (ref >90)
GFR calc non Af Amer: 74 mL/min — ABNORMAL LOW (ref >90)
Potassium: 4 mEq/L (ref 3.7–5.3)
Total Bilirubin: 0.6 mg/dL (ref 0.3–1.2)

## 2013-04-15 LAB — CBC WITH DIFFERENTIAL/PLATELET
Basophils Relative: 0 % (ref 0–1)
Eosinophils Absolute: 0.1 10*3/uL (ref 0.0–0.7)
HCT: 34.3 % — ABNORMAL LOW (ref 39.0–52.0)
Hemoglobin: 11.2 g/dL — ABNORMAL LOW (ref 13.0–17.0)
Lymphocytes Relative: 20 % (ref 12–46)
MCH: 30 pg (ref 26.0–34.0)
MCHC: 32.7 g/dL (ref 30.0–36.0)
Monocytes Absolute: 0.6 10*3/uL (ref 0.1–1.0)
Monocytes Relative: 12 % (ref 3–12)
Neutrophils Relative %: 65 % (ref 43–77)
RDW: 13.5 % (ref 11.5–15.5)
WBC: 4.9 10*3/uL (ref 4.0–10.5)

## 2013-04-15 LAB — HEMOGLOBIN A1C
Hgb A1c MFr Bld: 5.5 % (ref <5.7)
Mean Plasma Glucose: 111 mg/dL (ref <117)

## 2013-04-15 LAB — PROTIME-INR: Prothrombin Time: 13 seconds (ref 11.6–15.2)

## 2013-04-15 LAB — GLUCOSE, CAPILLARY
Glucose-Capillary: 101 mg/dL — ABNORMAL HIGH (ref 70–99)
Glucose-Capillary: 82 mg/dL (ref 70–99)
Glucose-Capillary: 90 mg/dL (ref 70–99)

## 2013-04-15 MED ORDER — ASPIRIN 300 MG RE SUPP
300.0000 mg | Freq: Every day | RECTAL | Status: DC
  Filled 2013-04-15 (×7): qty 1

## 2013-04-15 MED ORDER — LORAZEPAM 2 MG/ML IJ SOLN
0.5000 mg | Freq: Once | INTRAMUSCULAR | Status: DC
  Filled 2013-04-15 (×2): qty 1

## 2013-04-15 MED ORDER — HYDROMORPHONE HCL PF 1 MG/ML IJ SOLN
0.5000 mg | Freq: Once | INTRAMUSCULAR | Status: DC
  Filled 2013-04-15 (×2): qty 1

## 2013-04-15 MED ORDER — STROKE: EARLY STAGES OF RECOVERY BOOK
Freq: Once | Status: AC
  Administered 2013-04-15: 06:00:00
  Filled 2013-04-15: qty 1

## 2013-04-15 MED ORDER — ENOXAPARIN SODIUM 40 MG/0.4ML ~~LOC~~ SOLN
40.0000 mg | Freq: Every day | SUBCUTANEOUS | Status: DC
  Administered 2013-04-15 – 2013-04-20 (×6): 40 mg via SUBCUTANEOUS
  Filled 2013-04-15 (×7): qty 0.4

## 2013-04-15 MED ORDER — ASPIRIN 325 MG PO TABS
325.0000 mg | ORAL_TABLET | Freq: Every day | ORAL | Status: DC
  Administered 2013-04-16 – 2013-04-21 (×6): 325 mg via ORAL
  Filled 2013-04-15 (×7): qty 1

## 2013-04-15 NOTE — Progress Notes (Signed)
Pt has had 2 episodes of diarrhea on my shift and previous RN reports several episodes last night. Received order for cdiff. Will check next stool

## 2013-04-15 NOTE — Procedures (Signed)
History: 79 year old male with a history of transient episodes of slurred speech  Background: There is generalized irregular delta and theta throughout most of the study. Sleep structures are seen with symmetric distribution. There is a posterior dominant rhythm that achieves a maximal frequency of of 7 Hz that attenuates with eye opening. There is a single discharge that is bilaterally symmetric at O1,O2 > Pz > P8,P7 > P4,P3. It has a sharp wave morphology with an initial negative deflection.   Photic stimulation: Physiologic driving is not performed  EEG Abnormalities: 1) generalized slow activity  Clinical Interpretation: This EEG recorded single discharge concerning for an epileptiform nature in the bi-occipital region.  The bilateral distribution is unusual, but the morphology is consistent with an epileptiform discharge. This is suggestive of an area of potential epileptogenicity in one of the occipital regions with fast spread across the posterior commissure.   There is also a mild non-specific generalized non-specific cerebral dysfunction(encephalopathy). There was no seizure recorded on this study.   Ritta Slot, MD [facility] [phone]  If 7pm- 7am, please page neurology on call at [phone].

## 2013-04-15 NOTE — H&P (Signed)
[facility] History and Physical  Patient: Jermaine Lee  WGN:[id number]  DOB: 1921-12-26  DOS: the patient was seen and examined on 04/15/2013 PCP: Minda Meo, MD  Chief Complaint: Altered mental status  HPI: Jermaine Lee is a 79 y.o. male with Past medical history of Parkinson's disease, hypertension, bradycardia, subarachnoid hemorrhage, dementia. The patient is coming from home. The patient is not providing any history therefore history has been obtained from ED ED documentation and EMS documentation. The patient was initially brought in because of garbled speech that has been ongoing since Saturday and Sunday this was also sedated with shuffling gait. But the family his primary concern was he has been sleeping more than his usual. There were no fall or trauma reported. The family did not report any other focal deficit.  Review of Systems: as mentioned in the history of present illness.  A Comprehensive review of the other systems is negative.  Past Medical History  Diagnosis Date  . Parkinson's disease   . Hypertension   . Essential hypertension, benign 07/23/2012  . Other and unspecified hyperlipidemia 07/23/2012  . Prostate cancer 07/23/2012  . Recurrent falls 07/23/2012  . Hx of subdural hematoma 07/23/2012  . H/O subarachnoid hemorrhage 07/23/2012  . Parkinson's disease 07/23/2012  . Memory loss 07/23/2012   Past Surgical History  Procedure Laterality Date  . Abdominal surgery    . Back surgery    . Appendectomy     Social History:  reports that he has quit smoking. He has never used smokeless tobacco. He reports that he drinks about 0.6 ounces of alcohol per week. He reports that he does not use illicit drugs. Independent for most of his  ADL.  Allergies  Allergen Reactions  . Penicillins Hives  . Sulfa Antibiotics Hives    No family history on file.  Prior to Admission medications   Medication Sig Start Date End Date Taking? Authorizing Provider   benazepril (LOTENSIN) 10 MG tablet Take 1 tablet (10 mg total) by mouth daily. 08/19/12   Huston Foley, MD  busPIRone (BUSPAR) 15 MG tablet Take 15 mg by mouth daily as needed (for anxiety).    Historical Provider, MD  carbidopa-levodopa (SINEMET IR) 25-100 MG per tablet TAKE 1 AND 1/2 AT 7AM ON MON.WED.FRI.1 TAB TUES.THURS.SAT.SUN.1 AT NOON DAILY.1/2 AT BEDTIME DAILY 04/10/13   Huston Foley, MD  cholecalciferol (VITAMIN D) 1000 UNITS tablet Take 1,000 Units by mouth daily.    Historical Provider, MD  escitalopram (LEXAPRO) 5 MG tablet Take 1 tablet (5 mg total) by mouth daily with breakfast. 12/03/12   Huston Foley, MD  finasteride (PROSCAR) 5 MG tablet Take 5 mg by mouth daily.    Historical Provider, MD  vitamin C (ASCORBIC ACID) 500 MG tablet Take 500 mg by mouth daily.    Historical Provider, MD    Physical Exam: Filed Vitals:   04/15/13 0012 04/15/13 0210 04/15/13 0415 04/15/13 0612  BP: 172/59 141/52 187/63 149/62  Pulse: 51 47 50 51  Temp: 98.2 F (36.8 C)  97.9 F (36.6 C)   TempSrc: Oral     Resp: 18 14 18 16   Height: 5\' 11"  (1.803 m)     Weight: 64.048 kg (141 lb 3.2 oz)     SpO2: 95% 96% 97% 95%    General: Drowsy and difficult to arouse, follows command and withdraws to pain, moves all extremities spontaneously. Appear in moderate distress Eyes: PERRL ENT: Oral Mucosa clear dry. Neck: no JVD Cardiovascular:  S1 and S2 Present, ni Murmur, Peripheral Pulses Present Respiratory: Bilateral Air entry equal and Decreased, Clear to Auscultation,  no Crackles,no wheezes Abdomen: Bowel Sound Present, Soft and Non tender Skin: no Rash Extremities: no Pedal edema, no calf tenderness Neurologic: Mental status drowsy difficult to arouse, Cranial Nerves pupils are reactive has a cough, Motor strength bilaterally moving extremities spontaneously, Sensation withdraws to pain, reflexes intact reflexes, babinski equivocal, Proprioception difficult to assess, Cerebellar test difficult to  assess.  Labs on Admission:  CBC:  Recent Labs Lab 04/14/13 1840 04/15/13 0500  WBC 5.6 4.9  NEUTROABS 3.8 3.2  HGB 11.5* 11.2*  HCT 35.8* 34.3*  MCV 92.5 92.0  PLT 143* 128*    CMP     Component Value Date/Time   NA 143 04/14/2013 1840   K 4.2 04/14/2013 1840   CL 106 04/14/2013 1840   CO2 28 04/14/2013 1840   GLUCOSE 98 04/14/2013 1840   BUN 32* 04/14/2013 1840   CREATININE 1.01 04/14/2013 1840   CALCIUM 8.8 04/14/2013 1840   GFRNONAA 64* 04/14/2013 1840   GFRAA 74* 04/14/2013 1840    No results found for this basename: LIPASE, AMYLASE,  in the last 168 hours No results found for this basename: AMMONIA,  in the last 168 hours  No results found for this basename: CKTOTAL, CKMB, CKMBINDEX, TROPONINI,  in the last 168 hours BNP (last 3 results) No results found for this basename: PROBNP,  in the last 8760 hours  Radiological Exams on Admission: Dg Chest 2 View  04/14/2013   CLINICAL DATA:  Fatigue  EXAM: CHEST  2 VIEW  COMPARISON:  10/11/2011; 02/05/2008  FINDINGS: Grossly unchanged cardiac silhouette and mediastinal contours with tortuosity and possible mild ectasia of the thoracic aorta. Atherosclerotic plaque within the thoracic aorta. No focal airspace opacities. No pleural effusion or pneumothorax. No definite evidence of edema. No acute osseus abnormalities.  IMPRESSION: No acute cardiopulmonary disease.   Electronically Signed   By: Simonne Come M.D.   On: 04/14/2013 20:30   Ct Head Wo Contrast  04/15/2013   CLINICAL DATA:  Change in mental status with decreased responsiveness. Increasing sleeping.  EXAM: CT HEAD WITHOUT CONTRAST  TECHNIQUE: Contiguous axial images were obtained from the base of the skull through the vertex without intravenous contrast.  COMPARISON:  04/14/2013  FINDINGS: Diffuse cerebral atrophy. Low-attenuation changes in the deep white matter consistent with small vessel ischemic change. No mass effect or midline shift. No abnormal extra-axial  fluid collections. Gray-white matter junctions are distinct. Basal cisterns are not effaced. No evidence of acute intracranial hemorrhage. No depressed skull fractures. Partial opacification of the sphenoid sinuses with suggestion an air-fluid level. Mucosal thickening in the ethmoid air cells and maxillary antra. Stable appearance since previous study.  IMPRESSION: No acute intracranial abnormalities. No significant change since yesterday.   Electronically Signed   By: Burman Nieves M.D.   On: 04/15/2013 03:25   Ct Head Wo Contrast  04/14/2013   CLINICAL DATA:  EMS reports family st's pt had garbled speech Sat and Sun but resolved. St's he had garbled speech earlier today as well. Also st's pt has shuffling gait and sleeping more than usual.  EXAM: CT HEAD WITHOUT CONTRAST  TECHNIQUE: Contiguous axial images were obtained from the base of the skull through the vertex without intravenous contrast.  COMPARISON:  09/09/2012  FINDINGS: There is no evidence of mass effect, midline shift, or extra-axial fluid collections. There is no evidence of a space-occupying lesion or  intracranial hemorrhage. There is no evidence of a cortical-based area of acute infarction. There is generalized cerebral atrophy. There is periventricular white matter low attenuation likely secondary to microangiopathy.  The ventricles and sulci are appropriate for the patient's age. The basal cisterns are patent.  Visualized portions of the orbits are unremarkable. There is bilateral sphenoid sinus and right maxillary sinus mucosal thickening. There is mild ethmoid sinus mucosal thickening. There is a left sphenoid sinus air-fluid level. Cerebrovascular atherosclerotic calcifications are noted.  The osseous structures are unremarkable.  IMPRESSION: No acute intracranial pathology.   Electronically Signed   By: Elige Ko   On: 04/14/2013 18:34    EKG: Independently reviewed. normal sinus rhythm, nonspecific ST and T waves  changes.  Assessment/Plan Principal Problem:   TIA (transient ischemic attack) Active Problems:   Dysarthria   Acute encephalopathy   1. TIA (transient ischemic attack) The patient is coming in with complaints of slurred speech. His initial CT scan was negative for any acute stroke. Since there was a change in his neurological examination. He was responding appropriately to the nurse and he was not responding to my examination. I did another CT scan which again does not show any acute stroke. The possible etiology for his current presentation as TIA versus progressive worsening of his Parkinson's syndrome. It is also unclear whether he is taking his Parkinson medications on a regular basis. At present I would work up for the TIA including an echocardiogram carotid Doppler and MRI. I would also give him supposedly aspirin. Speech therapy PTOT will be consulted Neurology is following the patient  2. Parkinson's syndrome At present holding his medication as he is unable to take anything orally If this condition continues that he may require an NG tube to continue his Parkinson medications which I suspect is the cause for his current presentation  3. High blood pressure At present his blood pressure is stable I will hold his is antihypertensive  Consults: Neurology  DVT Prophylaxis: subcutaneous Heparin Nutrition: N.p.o.  Code Status: Full presumed  Disposition: Admitted to observation in telemetry unit.  Author: Lynden Oxford, MD [facility] Pager: [phone] 04/15/2013, 7:05 AM    If 7PM-7AM, please contact night-coverage [email or web address] Password [email or web address]

## 2013-04-15 NOTE — Progress Notes (Signed)
EEG completed; results pending.    

## 2013-04-15 NOTE — Progress Notes (Signed)
Utilization review completed.  

## 2013-04-15 NOTE — ED Provider Notes (Signed)
I saw and evaluated the patient, reviewed the resident's note and I agree with the findings and plan.  EKG Interpretation    Date/Time:    Ventricular Rate:    PR Interval:    QRS Duration:   QT Interval:    QTC Calculation:   R Axis:     Text Interpretation:             Patient with hx of parkinson's disease with several episodes, most recent earlier today, with slurred speech.  Patient back to his baseline.  No hx of CVA.  Neurologically intact.  CT neg.  Symptoms may be secondary to progressive parkinson's however patient will be admitted for TIA workup.  Shon Baton, MD 04/15/13 [phone]

## 2013-04-15 NOTE — Progress Notes (Signed)
Patient seen and evaluated earlier this AM by my associate. Currently undergoing work up for TIA.  Our team to reassess next am.  Penny Pia

## 2013-04-15 NOTE — Progress Notes (Signed)
Per family, pt's current condition normal when he sleeps (stating "he sleeps hard") but he is sleeping more than he is awake for the past several weeks.  Pt responds to pain, moves fingers when asked to grip hands, says "I don't know" when asked if he can open his eyes and then clenches his eyes shut when they are manually opened.  Pt's family reporting that this is his baseline when he is sleeping.  Will continue to monitor and notify MD.

## 2013-04-15 NOTE — Progress Notes (Signed)
Stroke Team Progress Note  HISTORY Jermaine Lee is an 79 y.o. male history Parkinson's disease, hypertension, traumatic subarachnoid and subdural hemorrhage and memory loss, brought to the emergency room 04/14/2013 by family following multiple episodes of transient slurred speech for the past couple days. Initial symptoms occurred on 04/12/2013. No focal weakness was noted including no facial droop, though wife reports he would not walk with a walker and she used a transport chair. Symptoms resolved in each instance. His last episode occurred this afternoon but resolved after arriving in the emergency room. CT scan of his head showed no acute intracranial abnormality. Patient has not been on antiplatelet therapy. Patient was not a TPA candidate secondary to rapidly resolving deficits. He was admitted for further evaluation and treatment.  SUBJECTIVE His wife and son are at the bedside.  Overall he feels his condition is gradually improving. Wife describes episodes as normal volume, she could not understand her but he could understand her. Son feels he has lost strength since last week. Wife has NA help at home and they provide for his physical care.  OBJECTIVE Most recent Vital Signs: Filed Vitals:   04/15/13 0012 04/15/13 0210 04/15/13 0415 04/15/13 0612  BP: 172/59 141/52 187/63 149/62  Pulse: 51 47 50 51  Temp: 98.2 F (36.8 C)  97.9 F (36.6 C)   TempSrc: Oral     Resp: 18 14 18 16   Height: 5\' 11"  (1.803 m)     Weight: 64.048 kg (141 lb 3.2 oz)     SpO2: 95% 96% 97% 95%   CBG (last 3)   Recent Labs  04/15/13 0545  GLUCAP 82    IV Fluid Intake:     MEDICATIONS  . aspirin EC  325 mg Oral Once  . aspirin  300 mg Rectal Daily   Or  . aspirin  325 mg Oral Daily  . enoxaparin (LOVENOX) injection  40 mg Subcutaneous Daily   PRN:    Diet:  NPO  Activity:  Bedrest DVT Prophylaxis:  Lovenox 40 mg sq daily   CLINICALLY SIGNIFICANT STUDIES Basic Metabolic Panel:  Recent  Labs Lab 04/14/13 1840 04/15/13 0550  NA 143 141  K 4.2 4.0  CL 106 104  CO2 28 25  GLUCOSE 98 81  BUN 32* 29*  CREATININE 1.01 0.88  CALCIUM 8.8 8.5   Liver Function Tests:  Recent Labs Lab 04/15/13 0550  AST 11  ALT 5  ALKPHOS 50  BILITOT 0.6  PROT 6.1  ALBUMIN 3.4*   CBC:  Recent Labs Lab 04/14/13 1840 04/15/13 0500  WBC 5.6 4.9  NEUTROABS 3.8 3.2  HGB 11.5* 11.2*  HCT 35.8* 34.3*  MCV 92.5 92.0  PLT 143* 128*   Coagulation:  Recent Labs Lab 04/15/13 0500  LABPROT 13.0  INR 1.00   Cardiac Enzymes: No results found for this basename: CKTOTAL, CKMB, CKMBINDEX, TROPONINI,  in the last 168 hours Urinalysis:  Recent Labs Lab 04/14/13 1945  COLORURINE AMBER*  LABSPEC 1.028  PHURINE 6.0  GLUCOSEU NEGATIVE  HGBUR NEGATIVE  BILIRUBINUR SMALL*  KETONESUR 15*  PROTEINUR NEGATIVE  UROBILINOGEN 1.0  NITRITE NEGATIVE  LEUKOCYTESUR SMALL*   Lipid Panel    Component Value Date/Time   CHOL 152 04/15/2013 0550   TRIG 53 04/15/2013 0550   HDL 57 04/15/2013 0550   CHOLHDL 2.7 04/15/2013 0550   VLDL 11 04/15/2013 0550   LDLCALC 84 04/15/2013 0550   HgbA1C  No results found for this basename: HGBA1C  Urine Drug Screen:   No results found for this basename: labopia, cocainscrnur, labbenz, amphetmu, thcu, labbarb    Alcohol Level: No results found for this basename: ETH,  in the last 168 hours   CT of the brain   04/15/2013    No acute intracranial abnormalities. No significant change since yesterday.  04/14/2013   No acute intracranial pathology.    MRI of the brain    MRA of the brain    2D Echocardiogram    Carotid Doppler    CXR  04/14/2013    No acute cardiopulmonary disease.    EKG  Sinus rhythm. Nonspecific intraventricular conduction delay.   EEG    Therapy Recommendations   Physical Exam   Neurologic Examination:  Mental Status:  Somnolent but arousable, disoriented to time and place. Low-volume dysarthric speech without  evidence of aphasia. Able to follow commands simple commands.  Cranial Nerves:  II-Visual fields were normal to visual threat.  III/IV/VI-Pupils were equal and reacted. Extraocular movements were full and conjugate.  V/VII-no facial numbness and no facial weakness.  VIII-normal.  X-dysarthric low-volume speech.  Motor: 4/5 bilaterally with INCREASED TONE Sensory: Difficult to assess  Deep Tendon Reflexes: 1+ and symmetric.  Plantars: Flexor bilaterally  Cerebellar: Unable to assess.  ASSESSMENT [name] is a 79 y.o. male presenting with recurrent dysarthria. Suspect stroke vs TIA or deconditioning/malnutrition/infection/dehydration.  On no antithrombotics prior to admission. Now ordered aspirin 325 mg orally every day or suppository for secondary stroke prevention though received neither as he was NPO and RN documented unable to given suppsoitory due to diarrhea. Patient with resultant dysarthria. Work up underway.   Parksinsons disease hypertension Hyperlipidemia, LDL 84, on no statin PTA, at goal LDL < 100 Hx SDH Baseline memory loss  Hospital day # 1  TREATMENT/PLAN  Continue aspirin po/suppository for secondary stroke prevention  F/u EEG  F/u HgbA1c  F/u 2D, carotid doppler, MRI, MRA  OOB, therapy evals  Annie Main, MSN, RN, ANVP-BC, ANP-BC, GNP-BC [facility] Pager: [phone] 04/15/2013 10:17 AM  I have personally obtained a history, examined the patient, evaluated imaging results, and formulated the assessment and plan of care. I agree with the above. DDX: TIA, stroke, deconditioning, malnutrition, infection, dehydration. Workup pending.  Suanne Marker, MD 04/15/2013, 11:08 PM Certified in Neurology, Neurophysiology and Neuroimaging [facility] - Stroke Team  Please refer to [email or web address] for on-call Stroke MD

## 2013-04-15 NOTE — Progress Notes (Signed)
Pt had some episodes of heart rate dropping to the 30s. His bp 136/102. I text paged Dr. Cena Benton, awaiting return of call

## 2013-04-15 NOTE — Progress Notes (Signed)
SLP Cancellation Note  Patient Details Name: Jermaine Lee MRN: [id number] DOB: 1922-03-11   Cancelled treatment:       Reason Eval/Treat Not Completed: SLP screened, no needs identified, will sign off. 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 No evidence of acute impairment with cognition or speech, language. Pt has attempted outpatient SLP treatment in the past but was not participatory per wife. No further SLP needs. See previous note for swallow/diet recommendations.    Braidon Chermak, Riley Nearing 04/15/2013, 1:20 PM

## 2013-04-15 NOTE — Evaluation (Signed)
Physical Therapy Evaluation Patient Details Name: Manuel Dall MRN: [id number] DOB: 09/20/1921 Today's Date: 04/15/2013 Time: 4132-4401 PT Time Calculation (min): 61 min  PT Assessment / Plan / Recommendation History of Present Illness  Aijalon Kirtz is an 79 y.o. male history Parkinson's disease, hypertension, traumatic subarachnoid and subdural hemorrhage and memory loss, brought to the emergency room 04/14/2013 by family following multiple episodes of transient slurred speech for the past couple days.  Clinical Impression  Pt admitted with slurred speech and currently being evaluated for possible TIA. Pt currently with functional limitations due to the deficits listed below (see PT Problem List). Pt will benefit from skilled PT to increase their independence and safety with mobility to allow discharge to the venue listed below. Pt's wife states she feels pt. Is near baseline for mobility and therefore recommend return home.  Pt. Active with [facility] for exercise 2 days/week and recommend pt. Continue exercise there.    PT Assessment  Patient needs continued PT services    Follow Up Recommendations  No PT follow up (recommend continued exercise at [facility])    Does the patient have the potential to tolerate intense rehabilitation      Barriers to Discharge        Equipment Recommendations  None recommended by PT    Recommendations for Other Services     Frequency Min 3X/week    Precautions / Restrictions Precautions Precautions: Fall Restrictions Weight Bearing Restrictions: No   Pertinent Vitals/Pain Denied pain      Mobility  Bed Mobility Bed Mobility: Rolling Right;Rolling Left;Right Sidelying to Sit;Sit to Supine;Scooting to [facility] Rolling Right: 3: Mod assist;With rail Rolling Left: 3: Mod assist;With rail Right Sidelying to Sit: 3: Mod assist;With rails;HOB elevated Sit to Supine: 3: Mod assist;HOB flat Scooting to HOB: 2: Max assist Details for  Bed Mobility Assistance: decreased initiation and decreased ability to complete tasks Transfers Transfers: Sit to Stand;Stand to Sit Sit to Stand: 2: Max assist;From bed;With upper extremity assist Stand to Sit: 3: Mod assist;To bed Details for Transfer Assistance: significant posterior lean with sit to stand.  Tactile cues given to correct.  Pt. incontinent of bowels upon standing therefore returned to sitting and assisted with pericare. Ambulation/Gait Ambulation/Gait Assistance: Not tested (comment) Stairs: No Wheelchair Mobility Wheelchair Mobility: No Modified Rankin (Stroke Patients Only) Pre-Morbid Rankin Score: Moderately severe disability Modified Rankin: Moderately severe disability    Exercises     PT Diagnosis: Difficulty walking;Abnormality of gait;Generalized weakness  PT Problem List: Decreased strength;Decreased range of motion;Decreased activity tolerance;Decreased balance;Decreased mobility;Decreased cognition;Decreased knowledge of use of DME;Decreased safety awareness;Decreased knowledge of precautions PT Treatment Interventions: DME instruction;Gait training;Functional mobility training;Therapeutic activities;Therapeutic exercise;Balance training;Neuromuscular re-education;Patient/family education     PT Goals(Current goals can be found in the care plan section) Acute Rehab PT Goals Patient Stated Goal: to go home PT Goal Formulation: With patient/family Time For Goal Achievement: 04/22/13 Potential to Achieve Goals: Fair  Visit Information  Last PT Received On: 04/15/13 Assistance Needed: +2 History of Present Illness: Allah Reason is an 79 y.o. male history Parkinson's disease, hypertension, traumatic subarachnoid and subdural hemorrhage and memory loss, brought to the emergency room 04/14/2013 by family following multiple episodes of transient slurred speech for the past couple days.       Prior Functioning  Home Living Family/patient expects to be  discharged to:: Private residence Living Arrangements: Spouse/significant other Available Help at Discharge: Personal care attendant (7 hrs/day, 7 days/week)  Type of Home: House Home Access: Level entry Home Layout: One level Home Equipment: Walker - 2 wheels Prior Function Level of Independence: Needs assistance Gait / Transfers Assistance Needed: moderate A ADL's / Homemaking Assistance Needed: total A Communication / Swallowing Assistance Needed: n/a Communication Communication: Other (comment) (soft voice)    Cognition  Cognition Arousal/Alertness: Awake/alert Behavior During Therapy: WFL for tasks assessed/performed Overall Cognitive Status: History of cognitive impairments - at baseline Area of Impairment: Memory;Awareness;Problem solving;Attention Current Attention Level: Sustained Memory: Decreased short-term memory Awareness: Intellectual Problem Solving: Slow processing;Decreased initiation    Extremity/Trunk Assessment Upper Extremity Assessment Upper Extremity Assessment: Defer to OT evaluation Lower Extremity Assessment Lower Extremity Assessment: Generalized weakness Cervical / Trunk Assessment Cervical / Trunk Assessment: Kyphotic   Balance Balance Balance Assessed: Yes Static Sitting Balance Static Sitting - Balance Support: No upper extremity supported;Feet supported Static Sitting - Level of Assistance: 4: Min assist Static Sitting - Comment/# of Minutes: 5  End of Session PT - End of Session Equipment Utilized During Treatment: Gait belt Activity Tolerance: Treatment limited secondary to medical complications (Comment) (pt. incontinent of bowels x 2 during session) Patient left: in bed;with family/visitor present;with nursing/sitter in room Nurse Communication: Mobility status  GP     Moshe Cipro K 04/15/2013, 12:50 PM  Clarita Crane, PT, DPT [phone]

## 2013-04-15 NOTE — Evaluation (Signed)
Clinical/Bedside Swallow Evaluation Patient Details  Name: Jermaine Lee MRN: [id number] Date of Birth: 05/02/1921  Today's Date: 04/15/2013 Time: 4098-1191 SLP Time Calculation (min): 11 min  Past Medical History:  Past Medical History  Diagnosis Date  . Parkinson's disease   . Hypertension   . Essential hypertension, benign 07/23/2012  . Other and unspecified hyperlipidemia 07/23/2012  . Prostate cancer 07/23/2012  . Recurrent falls 07/23/2012  . Hx of subdural hematoma 07/23/2012  . H/O subarachnoid hemorrhage 07/23/2012  . Parkinson's disease 07/23/2012  . Memory loss 07/23/2012   Past Surgical History:  Past Surgical History  Procedure Laterality Date  . Abdominal surgery    . Back surgery    . Appendectomy     HPI:  Jermaine Lee is an 79 y.o. male history Parkinson's disease, hypertension, traumatic subarachnoid and subdural hemorrhage and memory loss, brought to the emergency room by family following multiple episodes of transient slurred speech for the past couple days. Initial symptoms occurred on 04/12/2013. No focal weakness was noted including no facial droop. Symptoms resolved in each instance. His last episode occurred this afternoon but resolved after arriving in the emergency room. CT scan of his head showed no acute intracranial abnormality.   Assessment / Plan / Recommendation Clinical Impression  Pt demonstrates evidence of mild, likely baseline age/dementia related dysphagia characterized by audible swallow with minimal throat clear x1 over 8 oz intake. Pts family reports that at baseline pt needs min assist for feeding due to tremor, but is able to masticate solids well, which is unchanged today. Recommend pt initiate a regular diet and thin liquids with upright posture. No SLP f/u needed for swallowing.     Aspiration Risk  Mild    Diet Recommendation Regular;Thin liquid   Liquid Administration via: Straw;Cup Medication Administration: Whole meds with  liquid Supervision: Patient able to self feed;Intermittent supervision to cue for compensatory strategies Compensations: Slow rate;Small sips/bites Postural Changes and/or Swallow Maneuvers: Seated upright 90 degrees;Out of bed for meals    Other  Recommendations     Follow Up Recommendations  None    Frequency and Duration        Pertinent Vitals/Pain NA    SLP Swallow Goals     Swallow Study Prior Functional Status       General HPI: Jermaine Lee is an 79 y.o. male history Parkinson's disease, hypertension, traumatic subarachnoid and subdural hemorrhage and memory loss, brought to the emergency room by family following multiple episodes of transient slurred speech for the past couple days. Initial symptoms occurred on 04/12/2013. No focal weakness was noted including no facial droop. Symptoms resolved in each instance. His last episode occurred this afternoon but resolved after arriving in the emergency room. CT scan of his head showed no acute intracranial abnormality. Type of Study: Bedside swallow evaluation Previous Swallow Assessment: none Diet Prior to this Study: NPO Temperature Spikes Noted: No Respiratory Status: Room air History of Recent Intubation: No Behavior/Cognition: Alert;Cooperative;Pleasant mood Oral Cavity - Dentition: Adequate natural dentition Self-Feeding Abilities: Able to feed self Patient Positioning:  (edge of bed with PT) Baseline Vocal Quality: Low vocal intensity Volitional Cough: Strong Volitional Swallow: Able to elicit    Oral/Motor/Sensory Function Overall Oral Motor/Sensory Function: Appears within functional limits for tasks assessed   Ice Chips     Thin Liquid Thin Liquid: Within functional limits Presentation: Cup;Straw;Self Fed Other Comments: audible swallow, perhaps slight delay    Nectar Thick Nectar Thick Liquid: Not  tested   Honey Thick Honey Thick Liquid: Not tested   Puree Puree: Within functional limits    Solid   GO    Solid: Within functional limits Presentation: Self Floyce Stakes, MA CCC-SLP [phone]  Caila Cirelli, Riley Nearing 04/15/2013,11:29 AM

## 2013-04-15 NOTE — Progress Notes (Signed)
VASCULAR LAB PRELIMINARY  PRELIMINARY  PRELIMINARY  PRELIMINARY  Carotid duplex completed.    Preliminary report:  Bilateral:  1-39% ICA stenosis.  Vertebral artery flow is antegrade.     Jermaine Lee, RVS 04/15/2013, 12:23 PM

## 2013-04-16 ENCOUNTER — Inpatient Hospital Stay (HOSPITAL_COMMUNITY): Payer: Medicare Other

## 2013-04-16 DIAGNOSIS — I359 Nonrheumatic aortic valve disorder, unspecified: Secondary | ICD-10-CM

## 2013-04-16 LAB — GLUCOSE, CAPILLARY
Glucose-Capillary: 84 mg/dL (ref 70–99)
Glucose-Capillary: 116 mg/dL — ABNORMAL HIGH (ref 70–99)
Glucose-Capillary: 149 mg/dL — ABNORMAL HIGH (ref 70–99)

## 2013-04-16 LAB — CLOSTRIDIUM DIFFICILE BY PCR: Toxigenic C. Difficile by PCR: NEGATIVE

## 2013-04-16 MED ORDER — FINASTERIDE 5 MG PO TABS
5.0000 mg | ORAL_TABLET | Freq: Every day | ORAL | Status: DC
  Administered 2013-04-16 – 2013-04-21 (×6): 5 mg via ORAL
  Filled 2013-04-16 (×6): qty 1

## 2013-04-16 MED ORDER — CARBIDOPA-LEVODOPA 25-100 MG PO TABS: 1.0000 | ORAL_TABLET | Freq: Three times a day (TID) | ORAL | Status: DC

## 2013-04-16 MED ORDER — ESCITALOPRAM OXALATE 5 MG PO TABS
5.0000 mg | ORAL_TABLET | Freq: Every day | ORAL | Status: DC
  Administered 2013-04-16 – 2013-04-21 (×6): 5 mg via ORAL
  Filled 2013-04-16 (×8): qty 1

## 2013-04-16 MED ORDER — VITAMIN D3 25 MCG (1000 UNIT) PO TABS
1000.0000 [IU] | ORAL_TABLET | Freq: Every day | ORAL | Status: DC
  Administered 2013-04-16 – 2013-04-21 (×6): 1000 [IU] via ORAL
  Filled 2013-04-16 (×6): qty 1

## 2013-04-16 MED ORDER — CARBIDOPA-LEVODOPA 25-100 MG PO TABS
0.5000 | ORAL_TABLET | Freq: Every evening | ORAL | Status: DC
  Administered 2013-04-16 – 2013-04-20 (×5): 0.5 via ORAL
  Filled 2013-04-16 (×6): qty 0.5

## 2013-04-16 MED ORDER — VITAMIN C 500 MG PO TABS
500.0000 mg | ORAL_TABLET | Freq: Every day | ORAL | Status: DC
  Administered 2013-04-16 – 2013-04-21 (×6): 500 mg via ORAL
  Filled 2013-04-16 (×6): qty 1

## 2013-04-16 MED ORDER — BENAZEPRIL HCL 10 MG PO TABS
10.0000 mg | ORAL_TABLET | Freq: Every day | ORAL | Status: DC
  Administered 2013-04-16 – 2013-04-21 (×6): 10 mg via ORAL
  Filled 2013-04-16 (×6): qty 1

## 2013-04-16 MED ORDER — CARBIDOPA-LEVODOPA 25-100 MG PO TABS
1.0000 | ORAL_TABLET | Freq: Once | ORAL | Status: AC
  Administered 2013-04-16: 1 via ORAL

## 2013-04-16 MED ORDER — CARBIDOPA-LEVODOPA 25-100 MG PO TABS
1.0000 | ORAL_TABLET | Freq: Two times a day (BID) | ORAL | Status: DC
  Administered 2013-04-16 – 2013-04-21 (×11): 1 via ORAL
  Filled 2013-04-16 (×14): qty 1

## 2013-04-16 NOTE — Care Management Note (Unsigned)
    Page 1 of 1   04/16/2013     3:02:09 PM   CARE MANAGEMENT NOTE 04/16/2013  Patient:  Jermaine Lee, Jermaine Lee   Account Number:  [id number]  Date Initiated:  04/16/2013  Documentation initiated by:  GRAVES-BIGELOW,Reyah Streeter  Subjective/Objective Assessment:   Pt admitted for Altered mental status. Pt is from home with wife and he has a caregiver 7 days a week. Pt has services for at least 8 hours/day.     Action/Plan:   PT did see pt and will probably recommend SNF for safest d/c plan. Family does not want to entertain SNF option at this time. They want to increase the private caregiver to at least 12 hours per day. CM will continue to monitor for needs.   Anticipated DC Date:  04/18/2013   Anticipated DC Plan:  HOME/SELF CARE      DC Planning Services  CM consult      Choice offered to / List presented to:             Status of service:  In process, will continue to follow Medicare Important Message given?   (If response is "NO", the following Medicare IM given date fields will be blank) Date Medicare IM given:   Date Additional Medicare IM given:    Discharge Disposition:    Per UR Regulation:  Reviewed for med. necessity/level of care/duration of stay  If discussed at Long Length of Stay Meetings, dates discussed:    Comments:

## 2013-04-16 NOTE — Progress Notes (Signed)
Stroke Team Progress Note  HISTORY Jermaine Lee is an 79 y.o. male history Parkinson's disease, hypertension, traumatic subarachnoid and subdural hemorrhage and memory loss, brought to the emergency room 04/14/2013 by family following multiple episodes of transient slurred speech for the past couple days. Initial symptoms occurred on 04/12/2013. No focal weakness was noted including no facial droop, though wife reports he would not walk with a walker and she used a transport chair. Symptoms resolved in each instance. His last episode occurred this afternoon but resolved after arriving in the emergency room. CT scan of his head showed no acute intracranial abnormality. Patient has not been on antiplatelet therapy. Patient was not a TPA candidate secondary to rapidly resolving deficits. He was admitted for further evaluation and treatment.  SUBJECTIVE His wife and son are at the bedside (a different son from yesterday). He has NA care at home.   OBJECTIVE Most recent Vital Signs: Filed Vitals:   04/15/13 2037 04/16/13 0003 04/16/13 0400 04/16/13 0647  BP:  155/64 195/59 159/64  Pulse: 54 54 55   Temp: 99.1 F (37.3 C) 98.9 F (37.2 C) 98.9 F (37.2 C)   TempSrc: Oral Oral Oral   Resp: 18 18 18    Height:      Weight:   62.914 kg (138 lb 11.2 oz)   SpO2: 96% 96% 96%    CBG (last 3)   Recent Labs  04/15/13 2040 04/15/13 2351 04/16/13 0619  GLUCAP 103* 101* 84    IV Fluid Intake:     MEDICATIONS  . aspirin EC  325 mg Oral Once  . aspirin  300 mg Rectal Daily   Or  . aspirin  325 mg Oral Daily  . enoxaparin (LOVENOX) injection  40 mg Subcutaneous Daily  .  HYDROmorphone (DILAUDID) injection  0.5 mg Intravenous Once  . LORazepam  0.5 mg Intravenous Once   PRN:    Diet:  General thin liquids Activity:  As tolerated DVT Prophylaxis:  Lovenox 40 mg sq daily   CLINICALLY SIGNIFICANT STUDIES Basic Metabolic Panel:   Recent Labs Lab 04/14/13 1840 04/15/13 0550  NA 143  141  K 4.2 4.0  CL 106 104  CO2 28 25  GLUCOSE 98 81  BUN 32* 29*  CREATININE 1.01 0.88  CALCIUM 8.8 8.5   Liver Function Tests:   Recent Labs Lab 04/15/13 0550  AST 11  ALT 5  ALKPHOS 50  BILITOT 0.6  PROT 6.1  ALBUMIN 3.4*   CBC:   Recent Labs Lab 04/14/13 1840 04/15/13 0500  WBC 5.6 4.9  NEUTROABS 3.8 3.2  HGB 11.5* 11.2*  HCT 35.8* 34.3*  MCV 92.5 92.0  PLT 143* 128*   Coagulation:   Recent Labs Lab 04/15/13 0500  LABPROT 13.0  INR 1.00   Cardiac Enzymes: No results found for this basename: CKTOTAL, CKMB, CKMBINDEX, TROPONINI,  in the last 168 hours Urinalysis:   Recent Labs Lab 04/14/13 1945  COLORURINE AMBER*  LABSPEC 1.028  PHURINE 6.0  GLUCOSEU NEGATIVE  HGBUR NEGATIVE  BILIRUBINUR SMALL*  KETONESUR 15*  PROTEINUR NEGATIVE  UROBILINOGEN 1.0  NITRITE NEGATIVE  LEUKOCYTESUR SMALL*   Lipid Panel    Component Value Date/Time   CHOL 152 04/15/2013 0550   TRIG 53 04/15/2013 0550   HDL 57 04/15/2013 0550   CHOLHDL 2.7 04/15/2013 0550   VLDL 11 04/15/2013 0550   LDLCALC 84 04/15/2013 0550   HgbA1C  Lab Results  Component Value Date   HGBA1C 5.5 04/15/2013  Urine Drug Screen:   No results found for this basename: labopia,  cocainscrnur,  labbenz,  amphetmu,  thcu,  labbarb    Alcohol Level: No results found for this basename: ETH,  in the last 168 hours   CT of the brain   04/15/2013    No acute intracranial abnormalities. No significant change since yesterday.  04/14/2013   No acute intracranial pathology.    MRI of the brain 04/16/2013   Remarkably normal exam for a person of this age. Mild age related atrophy without evidence of old or acute small or large vessel infarction. Some inflammatory changes of the sinuses, most pronounced in the sphenoid sinus.    MRA of the brain  04/16/2013  No major vessel occlusion. Anterior circulation vessels widely patent. Right vertebral artery diminutive between the posterior inferior  cerebellar artery and the basilar. This could be congenital or reflect a diseased segment of vessel.    2D Echocardiogram    Carotid Doppler  No evidence of hemodynamically significant internal carotid artery stenosis. Vertebral artery flow is antegrade.   CXR  04/14/2013    No acute cardiopulmonary disease.    EKG  Sinus rhythm. Nonspecific intraventricular conduction delay.   EEG  This EEG recorded single discharge concerning for an epileptiform nature in the bi-occipital region. The bilateral distribution is unusual, but the morphology is consistent with an epileptiform discharge. This is suggestive of an area of potential epileptogenicity in one of the occipital regions with fast spread across the posterior commissure.  There is also a mild non-specific generalized non-specific cerebral dysfunction(encephalopathy). There was no seizure recorded on this study.   Therapy Recommendations continued exercise at ACT by [place], no ST needs  Physical Exam   Neurologic Examination:  Mental Status:  Somnolent but arousable, disoriented to time and place. Low-volume dysarthric speech without evidence of aphasia. Able to follow commands simple commands.  Cranial Nerves:  II-Visual fields were normal to visual threat.  III/IV/VI-Pupils were equal and reacted. Extraocular movements were full and conjugate.  V/VII-no facial numbness and no facial weakness.  VIII-normal.  X-dysarthric low-volume speech.  Motor: 4/5 bilaterally with INCREASED TONE Sensory: Difficult to assess  Deep Tendon Reflexes: 1+ and symmetric.  Plantars: Flexor bilaterally  Cerebellar: Unable to assess.  ASSESSMENT [name] is a 79 y.o. male presenting with recurrent dysarthria. Dx:  Worsening of parkinsons symptoms, no stroke or TIA diagnosis.  On no antithrombotics prior to admission. Now ordered aspirin 325 mg orally every day or suppository for secondary stroke prevention though received neither as he was NPO  and RN documented unable to given suppsoitory due to diarrhea. Today, patient with increased lethargy, due to pt not being on Parkinsons medications since admission.   Parksinsons disease hypertension Hyperlipidemia, LDL 84, on no statin PTA, at goal LDL < 100 Hx SDH Baseline memory loss Diarrhea, c diff negative Bradycardia, 30s  Hospital day # 2  TREATMENT/PLAN  Continue aspirin 325 mg orally every day for secondary stroke prevention  F/u 2D  Resume home medications  Likely will adjust Parkinsons medications as an OP  Repeat EEG in 1 month. Would not treat with antiepileptics at this time.  Follow up Dr. Frances Furbish in 1 month   Ok for discharge home today from neuro standpoint  Annie Main, MSN, RN, ANVP-BC, ANP-BC, [facility] Pager: [phone] 04/16/2013 9:15 AM  I have personally obtained a history, examined the patient, evaluated imaging results, and formulated the assessment and  plan of care.  Suspect patients symptoms are likely related to wearing off of PD medications/progression of disease. He would benefit from outpatient adjustment/titration of his Sinemet. EEG findings do not fully explain his current symptoms, with normal brain MRI would hold off on starting AED at this time. Plan to repeat EEG in 1 month as outpatient. Restarted patients outpatient Sinemet dose/schedule as he has not received this medication in >24hrs. This is likely why his mental status is depressed today. No further neurological workup required at this time. Will follow up with Dr Frances Furbish at [facility] upon discharge.

## 2013-04-16 NOTE — Progress Notes (Signed)
  Echocardiogram 2D Echocardiogram has been performed.  Jermaine Lee 04/16/2013, 11:38 AM

## 2013-04-16 NOTE — Progress Notes (Signed)
Physical Therapy Treatment Patient Details Name: Jermaine Lee MRN: [id number] DOB: 1921-11-09 Today's Date: 04/16/2013 Time: 4098-1191 PT Time Calculation (min): 44 min  PT Assessment / Plan / Recommendation  History of Present Illness Jermaine Lee is an 79 y.o. male history Parkinson's disease, hypertension, traumatic subarachnoid and subdural hemorrhage and memory loss, brought to the emergency room 04/14/2013 by family following multiple episodes of transient slurred speech for the past couple days.   PT Comments   Patient's family report one fall/week is his normal level of function.  Due to this we are recommending a higher level of care than he was receiving before to ensure both his and his wife's safety.  Options of SNF and HHPT discussed with family, and they deny interest.  We continue to recommend SNF placement, though realize that the family has other wishes.  Their wishes are to increase the time that their private caregiver is at the house.  This is in response to discussing that we don't think that he is safe to be home with wife as the only caregiver, especially when ambulating.  Family also educated to not leave him alone while he is standing.  Patient has deficits in safety and mobility, and will benefit from skilled PT intervention to address these.  Spoke with OT about d/c planning as well.   Follow Up Recommendations  SNF (Though family reports their preference for home with increased hours from her NA)           Equipment Recommendations  None recommended by PT       Frequency Min 3X/week      Plan Discharge plan needs to be updated    Precautions / Restrictions Precautions Precautions: Fall Restrictions Weight Bearing Restrictions: No   Pertinent Vitals/Pain None reported    Mobility  Bed Mobility  Supine to Sit: 1: +2 Total assist Supine to Sit: Patient Percentage: 50% Details for Bed Mobility Assistance: Pt requires A to move legs to EOB while  the other therapist supports his trunk to allow him to achieve upright sitting Transfers Transfers: Sit to Stand;Stand to Sit Sit to Stand: 1: +2 Total assist;From bed;With upper extremity assist Sit to Stand: Patient Percentage: 20% Stand to Sit: 1: +2 Total assist;To bed Stand to Sit: Patient Percentage: 50% Details for Transfer Assistance: Pt leans posteriorly through the process of standing and also one standing has been achieved.  VC and TC required to have him maintain good posture.  Patient requires extra time for all tasks.  Patient requires A under his hips by pulling up on the pad to A with standing. Ambulation/Gait Ambulation/Gait Assistance: 1: +2 Total assist Ambulation/Gait: Patient Percentage: 80% Ambulation Distance (Feet): 150 Feet Assistive device: Rolling walker Ambulation/Gait Assistance Details: Patient does better once he has some forward momentum going with walking.  He has a parkinson's gait pattern of short choppy steps that is only minimally improved with cuing.  Patient has trouble steering RW so one PT got in front to help with steering while the other provided A at his trunk.   Gait Pattern: Step-to pattern;Decreased stride length Gait velocity: decreased Stairs: No      PT Goals (current goals can now be found in the care plan section) Acute Rehab PT Goals Patient Stated Goal: to go home PT Goal Formulation: With patient/family Time For Goal Achievement: 04/22/13  Visit Information  Last PT Received On: 04/16/13 Assistance Needed: +2 History of Present Illness: Jermaine Lee is  an 79 y.o. male history Parkinson's disease, hypertension, traumatic subarachnoid and subdural hemorrhage and memory loss, brought to the emergency room 04/14/2013 by family following multiple episodes of transient slurred speech for the past couple days.    Subjective Data  Patient Stated Goal: to go home   Cognition  Cognition Arousal/Alertness: Awake/alert Behavior  During Therapy: Flat affect Overall Cognitive Status: History of cognitive impairments - at baseline Area of Impairment: Memory;Awareness;Problem solving;Attention;Orientation;Safety/judgement Orientation Level: Disoriented to;Place;Time;Situation Current Attention Level: Sustained Memory: Decreased short-term memory Safety/Judgement: Decreased awareness of deficits;Decreased awareness of safety Awareness: Intellectual Problem Solving: Slow processing;Decreased initiation;Difficulty sequencing;Requires verbal cues;Requires tactile cues General Comments: [place] requires extra time to speak, and needs encouragement to speak louder    Balance    End of Session PT - End of Session Equipment Utilized During Treatment: Gait belt Activity Tolerance: Treatment limited secondary to medical complications (Comment) Patient left: in bed;with family/visitor present Nurse Communication: Mobility status   GP    Barrie Dunker, SPT Pager:  [phone]  Barrie Dunker 04/16/2013, 4:06 PM

## 2013-04-16 NOTE — Progress Notes (Signed)
04/16/2013   PT addendum: Family members (son) found me in the hallway and they have spoken with the wife, the pt and each other about concerns brought forward during our PT session today.  They are now interested in pursuing SNF for rehab in [place]  Charge RN updated due to SW and RN case Production designer, theatre/television/film no longer here.  Consulting civil engineer to put in Social Work Consult.    Thanks, Rollene Rotunda. Theodis Kinsel, PT, DPT [phone]

## 2013-04-16 NOTE — Progress Notes (Signed)
Read, reviewed, edited and agree with student's findings and recommendations.  Alexxa Sabet B. Carley Strickling, PT, DPT #[id number]  

## 2013-04-16 NOTE — Evaluation (Addendum)
Occupational Therapy Evaluation Patient Details Name: Jermaine Lee MRN: [id number] DOB: 1922-01-10 Today's Date: 04/16/2013 Time: 8657-8469 OT Time Calculation (min): 22 min  OT Assessment / Plan / Recommendation History of present illness Jermaine Lee is an 79 y.o. male history Parkinson's disease, hypertension, traumatic subarachnoid and subdural hemorrhage and memory loss, brought to the emergency room 04/14/2013 by family following multiple episodes of transient slurred speech for the past couple days.   Clinical Impression   PT admitted with slurred speech. Pt currently with functional limitiations due to the deficits listed below (see OT problem list). Question current level for d/c home without improvement with Parkinson's medication resumed Pt will benefit from skilled OT to increase their independence and safety with adls and balance to allow discharge home per family request. Question need for additional assistance if mobility does not incr.     OT Assessment  Patient needs continued OT Services    Follow Up Recommendations  SNF - Pt required total +2 (A) for all transfers. PT Chari Manning reports family agreeable to SNF search. Wife will also check for 24 hour caregiver for home. Pt currently has aid but not 24/7 aid.  Recommend SNF as most appropriate option.   Barriers to Discharge Other (comment) (elderly wife 39 yo to (A))    Equipment Recommendations   (TBA)    Recommendations for Other Services    Frequency  Min 2X/week    Precautions / Restrictions Precautions Precautions: Fall Restrictions Weight Bearing Restrictions: No   Pertinent Vitals/Pain None reported    ADL  Upper Body Dressing: +1 Total assistance Where Assessed - Upper Body Dressing: Supported sitting Toilet Transfer: +2 Total assistance Toilet Transfer: Patient Percentage: 10% Toilet Transfer Method: Stand pivot Toilet Transfer Equipment: Raised toilet seat with arms (or 3-in-1 over  toilet) Toileting - Clothing Manipulation and Hygiene: +2 Total assistance Toileting - Clothing Manipulation and Hygiene: Patient Percentage: 0% Where Assessed - Toileting Clothing Manipulation and Hygiene: Standing Equipment Used: Gait belt Transfers/Ambulation Related to ADLs: Pt total +2 for static standing at EOB. pt with strong posterior lean initially. pt voiding bowels at EOB. pt required two person (A) for static standing with third person for peri hygiene. Pt voiding bowels again during transfer. Pt required total A for all peri care. pt positioned in chair for static sitting eating ADL Comments: Pt with cogntiive deficits. Family very attentive to (A) of staff. pt currently without Parkinson's medicaiton and decr mobility compared to baseline. pt requried total +2 (A) for all transfers. Pt will need to incr mobility as precursor to d/c home. Pt s family would liek to d/c home. Pts wife reports "I am older than him you know" Pt's son reports "mother you are 5 days older"     OT Diagnosis: Generalized weakness;Cognitive deficits;Apraxia  OT Problem List: Decreased strength;Decreased activity tolerance;Impaired balance (sitting and/or standing);Decreased cognition;Decreased safety awareness;Decreased knowledge of use of DME or AE;Decreased coordination OT Treatment Interventions: Self-care/ADL training;Therapeutic exercise;Neuromuscular education;DME and/or AE instruction;Therapeutic activities;Cognitive remediation/compensation;Patient/family education;Balance training   OT Goals(Current goals can be found in the care plan section) Acute Rehab OT Goals Patient Stated Goal: to go home OT Goal Formulation: With family Time For Goal Achievement: 04/30/13 Potential to Achieve Goals: Good  Visit Information  Last OT Received On: 04/16/13 Assistance Needed: +2 History of Present Illness: Jermaine Lee is an 79 y.o. male history Parkinson's disease, hypertension, traumatic subarachnoid and  subdural hemorrhage and memory loss, brought to the emergency room 04/14/2013  by family following multiple episodes of transient slurred speech for the past couple days.       Prior Functioning     Home Living Family/patient expects to be discharged to:: Private residence Living Arrangements: Spouse/significant other Available Help at Discharge: Personal care attendant Type of Home: House Home Access: Level entry Home Layout: One level Home Equipment: Environmental consultant - 2 wheels Prior Function Level of Independence: Needs assistance Gait / Transfers Assistance Needed: moderate A ADL's / Homemaking Assistance Needed: total A Communication / Swallowing Assistance Needed: n/a Communication Communication: Other (comment) (soft voice) Dominant Hand: Right         Vision/Perception Vision - History Baseline Vision: Wears contacts Vision - Assessment Vision Assessment: Vision not tested   Cognition  Cognition Arousal/Alertness: Awake/alert Behavior During Therapy: Flat affect Overall Cognitive Status: History of cognitive impairments - at baseline Area of Impairment: Memory;Awareness;Problem solving;Attention;Orientation;Safety/judgement Orientation Level: Disoriented to;Place;Time;Situation Current Attention Level: Sustained Memory: Decreased short-term memory Safety/Judgement: Decreased awareness of deficits;Decreased awareness of safety Awareness: Emergent;Anticipatory Problem Solving: Slow processing;Decreased initiation General Comments: Pt speaking about topics non-related to therapy session. Pt providing answers that do not match the topic or question asked by therapist    Extremity/Trunk Assessment Upper Extremity Assessment Upper Extremity Assessment: Difficult to assess due to impaired cognition;Generalized weakness Lower Extremity Assessment Lower Extremity Assessment: Defer to PT evaluation Cervical / Trunk Assessment Cervical / Trunk Assessment: Kyphotic      Mobility Bed Mobility Bed Mobility: Rolling Left;Left Sidelying to Sit;Supine to Sit;Sitting - Scoot to [place] of Bed Rolling Left: 2: Max assist Left Sidelying to Sit: 2: Max assist;HOB elevated Supine to Sit: 2: Max assist;HOB elevated Sitting - Scoot to Edge of Bed: 1: +1 Total assist Details for Bed Mobility Assistance: Pt moving as a single unit. pt with bil LE crossed and very rigid body position Transfers Transfers: Sit to Stand;Stand to Sit Sit to Stand: 1: +2 Total assist;From bed Sit to Stand: Patient Percentage: 20% Stand to Sit: 1: +2 Total assist;To chair/3-in-1 Stand to Sit: Patient Percentage: 0% Details for Transfer Assistance: Pt with very rigid posterior lean at EOB. pt required  counting and rocking motion to help facilitate sit<>Stand. pt posterior leaning and needed extended time to progressing to total +2 50 % static standing. pt unable to progress to chair without physical (A) to swing hips     Exercise     Balance Static Sitting Balance Static Sitting - Balance Support: Bilateral upper extremity supported;Feet supported Static Sitting - Level of Assistance: 2: Max assist;3: Mod assist Static Sitting - Comment/# of Minutes: ~10    End of Session OT - End of Session Activity Tolerance: Patient tolerated treatment well Patient left: in chair;with call bell/phone within reach;with family/visitor present Nurse Communication: Mobility status;Precautions  GO     Harolyn Rutherford 04/16/2013, 2:48 PM Pager: [phone]

## 2013-04-16 NOTE — Progress Notes (Signed)
PROGRESS NOTE  Jermaine Lee GEX:[id number] DOB: November 10, 1921 DOA: 04/14/2013 PCP: Minda Meo, MD  HPI: Jermaine Lee is a 79 y.o. male with Past medical history of Parkinson's disease, hypertension, bradycardia, subarachnoid hemorrhage, dementia. The patient is coming from home. The patient is not providing any history therefore history has been obtained from ED ED documentation and EMS documentation. The patient was initially brought in because of garbled speech that has been ongoing since Saturday and Sunday this was also sedated with shuffling gait. But the family his primary concern was he has been sleeping more than his usual. There were no fall or trauma reported. The family did not report any other focal deficit.  Assessment/Plan: TIA / transient slurred speech  - Neurology has been consulted, appreciate assistance  - MRI without infarction, MRA without major vessel occlusion.  - 2D echo pending - EEG with discharge concerning for an epileptiform nature in the bi-occipital region. Per Neurology, this would not explain his current symptoms and will not start AED now; recommended follow up EEG as an outpatient.  - carotid duplex with no significant stenosis per preliminary read Parkinson's disease - continue home medications.  HTN - continue home medications HLD  BPH  Diet: regular Fluids: none DVT Prophylaxis: lovenox  Code Status: Full Family Communication: wife at bedside  Disposition Plan: inpatient, home when ready  Consultants:  Neurology  Procedures:  2D echo   Antibiotics - none  HPI/Subjective: - drowsy, no complaints.   Objective: Filed Vitals:   04/16/13 0003 04/16/13 0400 04/16/13 0647 04/16/13 1334  BP: 155/64 195/59 159/64 101/58  Pulse: 54 55  65  Temp: 98.9 F (37.2 C) 98.9 F (37.2 C)  98.1 F (36.7 C)  TempSrc: Oral Oral  Oral  Resp: 18 18  16   Height:      Weight:  62.914 kg (138 lb 11.2 oz)    SpO2: 96% 96%  99%     Intake/Output Summary (Last 24 hours) at 04/16/13 1407 Last data filed at 04/16/13 0420  Gross per 24 hour  Intake      0 ml  Output    900 ml  Net   -900 ml   Filed Weights   04/15/13 0012 04/16/13 0400  Weight: 64.048 kg (141 lb 3.2 oz) 62.914 kg (138 lb 11.2 oz)    Exam:  General:  NAD, sleepy  Cardiovascular: regular rate and rhythm, without MRG  Respiratory: good air movement, clear to auscultation throughout, no wheezing, ronchi or rales  Abdomen: soft, not tender to palpation, positive bowel sounds  MSK: no peripheral edema  Neuro: non focal  Data Reviewed: Basic Metabolic Panel:  Recent Labs Lab 04/14/13 1840 04/15/13 0550  NA 143 141  K 4.2 4.0  CL 106 104  CO2 28 25  GLUCOSE 98 81  BUN 32* 29*  CREATININE 1.01 0.88  CALCIUM 8.8 8.5   Liver Function Tests:  Recent Labs Lab 04/15/13 0550  AST 11  ALT 5  ALKPHOS 50  BILITOT 0.6  PROT 6.1  ALBUMIN 3.4*   CBC:  Recent Labs Lab 04/14/13 1840 04/15/13 0500  WBC 5.6 4.9  NEUTROABS 3.8 3.2  HGB 11.5* 11.2*  HCT 35.8* 34.3*  MCV 92.5 92.0  PLT 143* 128*   CBG:  Recent Labs Lab 04/15/13 1226 04/15/13 2040 04/15/13 2351 04/16/13 0619 04/16/13 1221  GLUCAP 90 103* 101* 84 116*    Recent Results (from the past 240 hour(s))  URINE CULTURE  Status: None   Collection Time    04/14/13  7:45 PM      Result Value Range Status   Specimen Description URINE, RANDOM   Final   Special Requests NONE   Final   Culture  Setup Time     Final   Value: 04/14/2013 21:46     Performed at [facility]   Colony Count     Final   Value: 70,000 COLONIES/ML     Performed at [facility]   Culture     Final   Value: GRAM NEGATIVE RODS     Performed at [facility]   Report Status PENDING   Incomplete  CLOSTRIDIUM DIFFICILE BY PCR     Status: None   Collection Time    04/15/13  5:22 PM      Result Value Range Status   C difficile by pcr NEGATIVE  NEGATIVE Final      Studies: Dg Chest 2 View  04/16/2013   CLINICAL DATA:  Weakness, recent stroke  EXAM: CHEST  2 VIEW  COMPARISON:  DG CHEST 2 VIEW dated 04/14/2013; DG RIBS UNILATERAL W/CHEST*R* dated 10/11/2011; CT ABD/PELVIS W CM dated 03/31/2011  FINDINGS: Stable cardiac silhouette with ectatic aorta. No effusion, infiltrate, or pneumothorax. No acute osseous abnormality.  IMPRESSION: No acute cardiopulmonary process.   Electronically Signed   By: Genevive Bi M.D.   On: 04/16/2013 00:13   Dg Chest 2 View  04/14/2013   CLINICAL DATA:  Fatigue  EXAM: CHEST  2 VIEW  COMPARISON:  10/11/2011; 02/05/2008  FINDINGS: Grossly unchanged cardiac silhouette and mediastinal contours with tortuosity and possible mild ectasia of the thoracic aorta. Atherosclerotic plaque within the thoracic aorta. No focal airspace opacities. No pleural effusion or pneumothorax. No definite evidence of edema. No acute osseus abnormalities.  IMPRESSION: No acute cardiopulmonary disease.   Electronically Signed   By: Simonne Come M.D.   On: 04/14/2013 20:30   Ct Head Wo Contrast  04/15/2013   CLINICAL DATA:  Change in mental status with decreased responsiveness. Increasing sleeping.  EXAM: CT HEAD WITHOUT CONTRAST  TECHNIQUE: Contiguous axial images were obtained from the base of the skull through the vertex without intravenous contrast.  COMPARISON:  04/14/2013  FINDINGS: Diffuse cerebral atrophy. Low-attenuation changes in the deep white matter consistent with small vessel ischemic change. No mass effect or midline shift. No abnormal extra-axial fluid collections. Gray-white matter junctions are distinct. Basal cisterns are not effaced. No evidence of acute intracranial hemorrhage. No depressed skull fractures. Partial opacification of the sphenoid sinuses with suggestion an air-fluid level. Mucosal thickening in the ethmoid air cells and maxillary antra. Stable appearance since previous study.  IMPRESSION: No acute intracranial abnormalities.  No significant change since yesterday.   Electronically Signed   By: Burman Nieves M.D.   On: 04/15/2013 03:25   Ct Head Wo Contrast  04/14/2013   CLINICAL DATA:  EMS reports family st's pt had garbled speech Sat and Sun but resolved. St's he had garbled speech earlier today as well. Also st's pt has shuffling gait and sleeping more than usual.  EXAM: CT HEAD WITHOUT CONTRAST  TECHNIQUE: Contiguous axial images were obtained from the base of the skull through the vertex without intravenous contrast.  COMPARISON:  09/09/2012  FINDINGS: There is no evidence of mass effect, midline shift, or extra-axial fluid collections. There is no evidence of a space-occupying lesion or intracranial hemorrhage. There is no evidence of a cortical-based area of acute  infarction. There is generalized cerebral atrophy. There is periventricular white matter low attenuation likely secondary to microangiopathy.  The ventricles and sulci are appropriate for the patient's age. The basal cisterns are patent.  Visualized portions of the orbits are unremarkable. There is bilateral sphenoid sinus and right maxillary sinus mucosal thickening. There is mild ethmoid sinus mucosal thickening. There is a left sphenoid sinus air-fluid level. Cerebrovascular atherosclerotic calcifications are noted.  The osseous structures are unremarkable.  IMPRESSION: No acute intracranial pathology.   Electronically Signed   By: Elige Ko   On: 04/14/2013 18:34   Mr Brain Wo Contrast  04/16/2013   CLINICAL DATA:  Altered mental status  EXAM: MRI HEAD WITHOUT CONTRAST  MRA HEAD WITHOUT CONTRAST  TECHNIQUE: Multiplanar, multiecho pulse sequences of the brain and surrounding structures were obtained without intravenous contrast. Angiographic images of the head were obtained using MRA technique without contrast.  COMPARISON:  Head CT 04/15/2013.  MRI 02/06/2008.  FINDINGS: MRI HEAD FINDINGS  Diffusion imaging does not show any acute or subacute infarction.  Brain shows mild generalized atrophy consistent with age. There is not a pattern of small vessel disease. No focal small-vessel or large vessel insults of any age. No mass lesion, hemorrhage, hydrocephalus or extra-axial collection. No pituitary mass. There is some inflammatory change of the paranasal sinuses most pronounced in the sphenoid sinus.  MRA HEAD FINDINGS  Both internal carotid arteries are widely patent into the brain. No siphon stenosis. The anterior and middle cerebral vessels are patent without proximal stenosis, aneurysm or vascular malformation. 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  The left vertebral artery is the dominant vessel which is widely patent to the basilar. Right vertebral artery is nondominant and largely terminates in the posterior inferior cerebellar artery. This segment of the vertebral artery between PICA and the basilar is small and irregular. This could be congenital or reflect a diseased segment of vessel. There is no basilar stenosis. Superior cerebellar and posterior cerebral vessels are patent.  IMPRESSION: MRI brain: Remarkably normal exam for a person of this age. Mild age related atrophy without evidence of old or acute small or large vessel infarction. Some inflammatory changes of the sinuses, most pronounced in the sphenoid sinus.  Intracranial MRA: No major vessel occlusion. Anterior circulation vessels widely patent. Right vertebral artery diminutive between the posterior inferior cerebellar artery and the basilar. This could be congenital or reflect a diseased segment of vessel.   Electronically Signed   By: Paulina Fusi M.D.   On: 04/16/2013 08:59   Mr Maxine Glenn Head/brain Wo Cm  04/16/2013   CLINICAL DATA:  Altered mental status  EXAM: MRI HEAD WITHOUT CONTRAST  MRA HEAD WITHOUT CONTRAST  TECHNIQUE: Multiplanar, multiecho pulse sequences of the brain and surrounding structures were obtained without intravenous contrast. Angiographic images of the head were  obtained using MRA technique without contrast.  COMPARISON:  Head CT 04/15/2013.  MRI 02/06/2008.  FINDINGS: MRI HEAD FINDINGS  Diffusion imaging does not show any acute or subacute infarction. Brain shows mild generalized atrophy consistent with age. There is not a pattern of small vessel disease. No focal small-vessel or large vessel insults of any age. No mass lesion, hemorrhage, hydrocephalus or extra-axial collection. No pituitary mass. There is some inflammatory change of the paranasal sinuses most pronounced in the sphenoid sinus.  MRA HEAD FINDINGS  Both internal carotid arteries are widely patent into the brain. No siphon stenosis. The anterior and middle cerebral vessels are patent without proximal  stenosis, aneurysm or vascular malformation. 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  The left vertebral artery is the dominant vessel which is widely patent to the basilar. Right vertebral artery is nondominant and largely terminates in the posterior inferior cerebellar artery. This segment of the vertebral artery between PICA and the basilar is small and irregular. This could be congenital or reflect a diseased segment of vessel. There is no basilar stenosis. Superior cerebellar and posterior cerebral vessels are patent.  IMPRESSION: MRI brain: Remarkably normal exam for a person of this age. Mild age related atrophy without evidence of old or acute small or large vessel infarction. Some inflammatory changes of the sinuses, most pronounced in the sphenoid sinus.  Intracranial MRA: No major vessel occlusion. Anterior circulation vessels widely patent. Right vertebral artery diminutive between the posterior inferior cerebellar artery and the basilar. This could be congenital or reflect a diseased segment of vessel.   Electronically Signed   By: Paulina Fusi M.D.   On: 04/16/2013 08:59    Scheduled Meds: . aspirin  300 mg Rectal Daily   Or  . aspirin  325 mg Oral Daily  . benazepril  10 mg Oral  Daily  . carbidopa-levodopa  0.5 tablet Oral QPM  . carbidopa-levodopa  1 tablet Oral BID WC  . cholecalciferol  1,000 Units Oral Daily  . enoxaparin (LOVENOX) injection  40 mg Subcutaneous Daily  . escitalopram  5 mg Oral Q breakfast  . finasteride  5 mg Oral Daily  .  HYDROmorphone (DILAUDID) injection  0.5 mg Intravenous Once  . LORazepam  0.5 mg Intravenous Once  . vitamin C  500 mg Oral Daily   Continuous Infusions:   Principal Problem:   TIA (transient ischemic attack) Active Problems:   Dysarthria   Acute encephalopathy  Time spent: 25  Pamella Pert, MD [facility] Pager [phone]. If 7 PM - 7 AM, please contact night-coverage at [email or web address], password [facility] 04/16/2013, 2:07 PM  LOS: 2 days

## 2013-04-17 ENCOUNTER — Inpatient Hospital Stay (HOSPITAL_COMMUNITY): Payer: Medicare Other

## 2013-04-17 ENCOUNTER — Encounter (HOSPITAL_COMMUNITY): Payer: Self-pay | Admitting: Cardiology

## 2013-04-17 DIAGNOSIS — R1310 Dysphagia, unspecified: Secondary | ICD-10-CM

## 2013-04-17 DIAGNOSIS — I429 Cardiomyopathy, unspecified: Secondary | ICD-10-CM

## 2013-04-17 DIAGNOSIS — Z515 Encounter for palliative care: Secondary | ICD-10-CM

## 2013-04-17 DIAGNOSIS — Z9181 History of falling: Secondary | ICD-10-CM

## 2013-04-17 DIAGNOSIS — R5381 Other malaise: Secondary | ICD-10-CM

## 2013-04-17 DIAGNOSIS — R5383 Other fatigue: Secondary | ICD-10-CM

## 2013-04-17 DIAGNOSIS — R531 Weakness: Secondary | ICD-10-CM

## 2013-04-17 LAB — GLUCOSE, CAPILLARY
GLUCOSE-CAPILLARY: 125 mg/dL — AB (ref 70–99)
Glucose-Capillary: 100 mg/dL — ABNORMAL HIGH (ref 70–99)
Glucose-Capillary: 107 mg/dL — ABNORMAL HIGH (ref 70–99)
Glucose-Capillary: 116 mg/dL — ABNORMAL HIGH (ref 70–99)
Glucose-Capillary: 118 mg/dL — ABNORMAL HIGH (ref 70–99)

## 2013-04-17 LAB — URINE CULTURE: Colony Count: 70000

## 2013-04-17 LAB — INFLUENZA PANEL BY PCR (TYPE A & B)
H1N1 flu by pcr: NOT DETECTED
INFLBPCR: NEGATIVE
Influenza A By PCR: NEGATIVE

## 2013-04-17 MED ORDER — SODIUM CHLORIDE 0.9 % IV BOLUS (SEPSIS)
250.0000 mL | Freq: Once | INTRAVENOUS | Status: AC
  Administered 2013-04-17: 250 mL via INTRAVENOUS

## 2013-04-17 MED ORDER — STROKE: EARLY STAGES OF RECOVERY BOOK
Freq: Once | Status: AC
  Administered 2013-04-17: 11:00:00
  Filled 2013-04-17: qty 1

## 2013-04-17 MED ORDER — LEVOFLOXACIN IN D5W 500 MG/100ML IV SOLN
500.0000 mg | INTRAVENOUS | Status: DC
  Administered 2013-04-17 – 2013-04-18 (×2): 500 mg via INTRAVENOUS
  Filled 2013-04-17 (×2): qty 100

## 2013-04-17 NOTE — Progress Notes (Signed)
Pt/s BP 80/33.  Dr. Cruzita Lederer notified.  Will continue to notify. Jermaine Lee, Jermaine Lee

## 2013-04-17 NOTE — Consult Note (Signed)
Consulting cardiologist: Dr. 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 Additional medical history is noted below. He had an echocardiogram as part of his workup which revealed an LVEF of 40-45% with grade 2 diastolic dysfunction and global hypokinesis. We are consulted to assist with his management.  Patient does not provide any history. His wife in the room indicates he has had a steady decline over the last several months, particularly within the last week. Not able to walk, also falling frequently when he tries despite having assistance at home.   There is no clear history of CAD or myocardial infarction identified. Does have long-standing hypertension. His lipid panel actually looked fairly good with an LDL of only 84.  He has not had any clear evidence of clinical CHF, chest x-ray was clear. No leg edema noted.  In speaking with the patient's wife, plan seems to be involvement of case manager to discuss placement options at this point.   Allergies  Allergen Reactions  . Penicillins Hives  . Sulfa Antibiotics Hives    Medications Scheduled Medications: . aspirin  300 mg Rectal Daily   Or  . aspirin  325 mg Oral Daily  . benazepril  10 mg Oral Daily  . carbidopa-levodopa  0.5 tablet Oral QPM  . carbidopa-levodopa  1 tablet Oral BID WC  . cholecalciferol  1,000 Units Oral Daily  . enoxaparin (LOVENOX) injection  40 mg Subcutaneous Daily  . escitalopram  5 mg Oral Q breakfast  . finasteride  5 mg Oral Daily  .  HYDROmorphone (DILAUDID) injection  0.5 mg Intravenous Once  . levofloxacin (LEVAQUIN) IV  500 mg Intravenous Q24H  . LORazepam  0.5 mg Intravenous Once  . sodium chloride  250 mL Intravenous Once  . vitamin C  500 mg Oral Daily     Past Medical  History  Diagnosis Date  . Parkinson's disease   . Essential hypertension, benign   . Mixed hyperlipidemia   . Prostate cancer   . Recurrent falls   . H/O subarachnoid hemorrhage   . GERD (gastroesophageal reflux disease)   . Orthostatic hypotension     Past Surgical History  Procedure Laterality Date  . Abdominal surgery    . Back surgery    . Appendectomy      History reviewed. No pertinent family history.  Social History [name] reports that he has quit smoking. His smoking use included Cigarettes. He smoked 0.00 packs per day. He has never used smokeless tobacco. [name] reports that he drinks about 0.6 ounces of alcohol per week.  Review of Systems Unable to obtain secondary to dementia.  Physical Examination Blood pressure 96/41, pulse 62, temperature 99 F (37.2 C), temperature source Axillary, resp. rate 16, height 5\' 11"  (1.803 m), weight 138 lb 11.2 oz (62.914 kg), SpO2 96.00%.  Intake/Output Summary (Last 24 hours) at 04/17/13 1407 Last data filed at 04/17/13 0800  Gross per 24 hour  Intake    270 ml  Output    600 ml  Net   -330 ml   Frail elderly male, no distress.  HEENT: Conjunctiva and lids normal, oropharynx clear. Neck: Supple, no elevated JVP or carotid bruits, no thyromegaly. Lungs: Clear to auscultation with a few scattered rhonchi, nonlabored breathing at rest. Cardiac: Regular  rate and rhythm, no S3 or significant systolic murmur, no pericardial rub. Abdomen: Soft, nontender, bowel sounds present. Extremities: No pitting edema, distal pulses 1-2+. Skin: Warm and dry. Musculoskeletal: No kyphosis. Neuropsychiatric: Does not answer questions, making only indistinct verbalizations, increased tone with mild diffuse weakness.   Lab Results  Basic Metabolic Panel:  Recent Labs Lab 04/14/13 1840 04/15/13 0550  NA 143 141  K 4.2 4.0  CL 106 104  CO2 28 25  GLUCOSE 98 81  BUN 32* 29*  CREATININE 1.01 0.88  CALCIUM 8.8 8.5     Liver Function Tests:  Recent Labs Lab 04/15/13 0550  AST 11  ALT 5  ALKPHOS 50  BILITOT 0.6  PROT 6.1  ALBUMIN 3.4*    CBC:  Recent Labs Lab 04/14/13 1840 04/15/13 0500  WBC 5.6 4.9  NEUTROABS 3.8 3.2  HGB 11.5* 11.2*  HCT 35.8* 34.3*  MCV 92.5 92.0  PLT 143* 128*    Echocardiogram Study Conclusions  - Left ventricle: The cavity size was normal. Wall thickness was increased in a pattern of mild LVH. Systolic function was mildly to moderately reduced. The estimated ejection fraction was in the range of 40% to 45%. Diffuse hypokinesis. Features are consistent with a pseudonormal left ventricular filling pattern, with concomitant abnormal relaxation and increased filling pressure (grade 2 diastolic dysfunction). - Aortic valve: Poorly visualized. There was no stenosis. Mild regurgitation. - Aorta: Dilated aortic root. Aortic root dimension: 68mm (ED). - Mitral valve: Trivial regurgitation. - Left atrium: The atrium was mildly dilated. - Right ventricle: Poorly visualized. The cavity size was normal. Systolic function was normal. - Tricuspid valve: Poorly visualized. - Pulmonary arteries: No complete TR doppler jet so unable to estimate PA systolic pressure. - Systemic veins: IVC not visualized. Impressions:  - Technically difficult study with poor acoustic windows. LV systolic function is probably at least mildly decreased with estimate EF 40-45%. The RV is poorly visualized but probably normal. Mild AI.   ECG Sinus rhythm with IVCD.   Imaging CHEST 2 VIEW  COMPARISON: DG CHEST 2 VIEW dated 04/14/2013; DG RIBS UNILATERAL W/CHEST*R* dated 10/11/2011; CT ABD/PELVIS W CM dated 03/31/2011  FINDINGS: Stable cardiac silhouette with ectatic aorta. No effusion, infiltrate, or pneumothorax. No acute osseous abnormality.  IMPRESSION: No acute cardiopulmonary process.   Impression  1. Cardiomyopathy, LVEF 40-45%. Duration and etiology not certain.  He does have a history of hypertension. There is description of global hypokinesis by echocardiography raising possibility of nonischemic cardiomyopathy. No evidence of volume overload at present. ECG shows sinus rhythm with IVCD.  2. Progressive functional decline in association with Parkinson's disease and dementia.   3. Recent TIA as outlined.  4. History of orthostatic hypotension and also bradycardia. This is likely to limit medical treatment options for his cardiomyopathy.   Recommendations  Reviewed the situation with the patient's wife. At this time would anticipate conservative medical management. He is already on aspirin and Lotensin. Would hold off beta blocker with history of bradycardia and orthostasis. Would try and keep intake and output relatively even. Not adding diuretic. No further ischemic workup is planned at this point.   Satira Sark, M.D., F.A.C.C.

## 2013-04-17 NOTE — Progress Notes (Signed)
PROGRESS NOTE  Jermaine Lee [id number] DOB: 1921-06-23 DOA: 04/14/2013 PCP: Geoffery Lyons, MD  HPI: Jermaine Lee is a 80 y.o. male with Past medical history of Parkinson's disease, hypertension, bradycardia, subarachnoid hemorrhage, dementia. The patient is coming from home. The patient is not providing any history therefore history has been obtained from ED ED documentation and EMS documentation. The patient was initially brought in because of garbled speech that has been ongoing since Saturday and Sunday this was also sedated with shuffling gait. But the family his primary concern was he has been sleeping more than his usual. There were no fall or trauma reported. The family did not report any other focal deficit.  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 Cardiology consulted as family would like to explore options, appreciate input.  UTI  - UA not impressive on admission, however urine cultures positive. Given more altered and low grade temp, will start Levofloxacin per sensitivities.   Cough  - new today, perhaps related to ?heart failure. Obtain CXR, Levaquin covers. - check influenza panel.  Parkinson's disease - with progressive decline over the past few months, decline in his mental status here in the hospital.  - I have consulted palliative medicine as well today TIA / transient slurred speech  - Neurology has been consulted, appreciate assistance, no stroke per MRI, signed off.  - MRI without infarction, MRA without major vessel occlusion.  - 2D echo done, EF [id number], diastolic dysfunction grade 2 present. Cards consult as above. - EEG with discharge concerning for an epileptiform nature in the bi-occipital region. Per Neurology, this would not explain his current symptoms and will not start AED now; recommended follow up EEG as an outpatient.  - carotid duplex with no significant stenosis per  preliminary read HTN - continue home medications HLD  BPH   Diet: regular Fluids: none DVT Prophylaxis: lovenox  Code Status: Full Family Communication: wife at bedside  Disposition Plan: inpatient, home when ready  Consultants:  Neurology  Cardiology  Palliative  Procedures:  2D echo   Antibiotics - Levofloxacin  HPI/Subjective: - drowsy, no complaints.   Objective: Filed Vitals:   04/16/13 1334 04/16/13 2206 04/17/13 0600 04/17/13 0800  BP: 101/58 148/65 145/57 110/59  Pulse: 65 64 76 76  Temp: 98.1 F (36.7 C) 98.4 F (36.9 C) 99.8 F (37.7 C) 99.9 F (37.7 C)  TempSrc: Oral Oral Oral Axillary  Resp: 16 16 18 17   Height:      Weight:      SpO2: 99% 9% 97% 95%    Intake/Output Summary (Last 24 hours) at 04/17/13 1018 Last data filed at 04/17/13 0401  Gross per 24 hour  Intake    240 ml  Output    600 ml  Net   -360 ml   Filed Weights   04/15/13 0012 04/16/13 0400  Weight: 64.048 kg (141 lb 3.2 oz) 62.914 kg (138 lb 11.2 oz)    Exam:  General:  NAD, sleepy  Cardiovascular: regular rate and rhythm, without MRG  Respiratory: good air movement, clear to auscultation throughout, no wheezing, ronchi or rales  Abdomen: soft, not tender to palpation, positive bowel sounds  MSK: no peripheral edema  Neuro: non focal  Data Reviewed: Basic Metabolic Panel:  Recent Labs Lab 04/14/13 1840 04/15/13 0550  NA 143 141  K 4.2 4.0  CL 106 104  CO2 28 25  GLUCOSE 98 81  BUN 32* 29*  CREATININE 1.01 0.88  CALCIUM 8.8 8.5   Liver Function Tests:  Recent Labs Lab 04/15/13 0550  AST 11  ALT 5  ALKPHOS 50  BILITOT 0.6  PROT 6.1  ALBUMIN 3.4*   CBC:  Recent Labs Lab 04/14/13 1840 04/15/13 0500  WBC 5.6 4.9  NEUTROABS 3.8 3.2  HGB 11.5* 11.2*  HCT 35.8* 34.3*  MCV 92.5 92.0  PLT 143* 128*   CBG:  Recent Labs Lab 04/16/13 0619 04/16/13 1221 04/16/13 1916 04/17/13 0009 04/17/13 0639  GLUCAP 84 116* 149* 107* 116*     Recent Results (from the past 240 hour(s))  URINE CULTURE     Status: None   Collection Time    04/14/13  7:45 PM      Result Value Range Status   Specimen Description URINE, RANDOM   Final   Special Requests NONE   Final   Culture  Setup Time     Final   Value: 04/14/2013 21:46     Performed at [facility]     Final   Value: 70,000 COLONIES/ML     Performed at [facility]   Culture     Final   Value: PROTEUS MIRABILIS     Performed at [facility]   Report Status 04/17/2013 FINAL   Final   Organism ID, Bacteria PROTEUS MIRABILIS   Final  CLOSTRIDIUM DIFFICILE BY PCR     Status: None   Collection Time    04/15/13  5:22 PM      Result Value Range Status   C difficile by pcr NEGATIVE  NEGATIVE Final     Studies: Dg Chest 2 View  04/16/2013   CLINICAL DATA:  Weakness, recent stroke  EXAM: CHEST  2 VIEW  COMPARISON:  DG CHEST 2 VIEW dated 04/14/2013; DG RIBS UNILATERAL W/CHEST*R* dated 10/11/2011; CT ABD/PELVIS W CM dated 03/31/2011  FINDINGS: Stable cardiac silhouette with ectatic aorta. No effusion, infiltrate, or pneumothorax. No acute osseous abnormality.  IMPRESSION: No acute cardiopulmonary process.   Electronically Signed   By: Suzy Bouchard M.D.   On: 04/16/2013 00:13   Mr Brain Wo Contrast  04/16/2013   CLINICAL DATA:  Altered mental status  EXAM: MRI HEAD WITHOUT CONTRAST  MRA HEAD WITHOUT CONTRAST  TECHNIQUE: Multiplanar, multiecho pulse sequences of the brain and surrounding structures were obtained without intravenous contrast. Angiographic images of the head were obtained using MRA technique without contrast.  COMPARISON:  Head CT 04/15/2013.  MRI 02/06/2008.  FINDINGS: MRI HEAD FINDINGS  Diffusion imaging does not show any acute or subacute infarction. Brain shows mild generalized atrophy consistent with age. There is not a pattern of small vessel disease. No focal small-vessel or large vessel insults of any age. No mass  lesion, hemorrhage, hydrocephalus or extra-axial collection. No pituitary mass. There is some inflammatory change of the paranasal sinuses most pronounced in the sphenoid sinus.  MRA HEAD FINDINGS  Both internal carotid arteries are widely patent into the brain. No siphon stenosis. The anterior and middle cerebral vessels are patent without proximal stenosis, aneurysm or vascular malformation. 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  The left vertebral artery is the dominant vessel which is widely patent to the basilar. Right vertebral artery is nondominant and largely terminates in the posterior inferior cerebellar artery. This segment of the vertebral artery between PICA and the basilar is small and irregular. This could be congenital or reflect a diseased  segment of vessel. There is no basilar stenosis. Superior cerebellar and posterior cerebral vessels are patent.  IMPRESSION: MRI brain: Remarkably normal exam for a person of this age. Mild age related atrophy without evidence of old or acute small or large vessel infarction. Some inflammatory changes of the sinuses, most pronounced in the sphenoid sinus.  Intracranial MRA: No major vessel occlusion. Anterior circulation vessels widely patent. Right vertebral artery diminutive between the posterior inferior cerebellar artery and the basilar. This could be congenital or reflect a diseased segment of vessel.   Electronically Signed   By: Nelson Chimes M.D.   On: 04/16/2013 08:59   Mr Jodene Nam Head/brain Wo Cm  04/16/2013   CLINICAL DATA:  Altered mental status  EXAM: MRI HEAD WITHOUT CONTRAST  MRA HEAD WITHOUT CONTRAST  TECHNIQUE: Multiplanar, multiecho pulse sequences of the brain and surrounding structures were obtained without intravenous contrast. Angiographic images of the head were obtained using MRA technique without contrast.  COMPARISON:  Head CT 04/15/2013.  MRI 02/06/2008.  FINDINGS: MRI HEAD FINDINGS  Diffusion imaging does not show any acute  or subacute infarction. Brain shows mild generalized atrophy consistent with age. There is not a pattern of small vessel disease. No focal small-vessel or large vessel insults of any age. No mass lesion, hemorrhage, hydrocephalus or extra-axial collection. No pituitary mass. There is some inflammatory change of the paranasal sinuses most pronounced in the sphenoid sinus.  MRA HEAD FINDINGS  Both internal carotid arteries are widely patent into the brain. No siphon stenosis. The anterior and middle cerebral vessels are patent without proximal stenosis, aneurysm or vascular malformation. 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  The left vertebral artery is the dominant vessel which is widely patent to the basilar. Right vertebral artery is nondominant and largely terminates in the posterior inferior cerebellar artery. This segment of the vertebral artery between PICA and the basilar is small and irregular. This could be congenital or reflect a diseased segment of vessel. There is no basilar stenosis. Superior cerebellar and posterior cerebral vessels are patent.  IMPRESSION: MRI brain: Remarkably normal exam for a person of this age. Mild age related atrophy without evidence of old or acute small or large vessel infarction. Some inflammatory changes of the sinuses, most pronounced in the sphenoid sinus.  Intracranial MRA: No major vessel occlusion. Anterior circulation vessels widely patent. Right vertebral artery diminutive between the posterior inferior cerebellar artery and the basilar. This could be congenital or reflect a diseased segment of vessel.   Electronically Signed   By: Nelson Chimes M.D.   On: 04/16/2013 08:59    Scheduled Meds: . aspirin  300 mg Rectal Daily   Or  . aspirin  325 mg Oral Daily  . benazepril  10 mg Oral Daily  . carbidopa-levodopa  0.5 tablet Oral QPM  . carbidopa-levodopa  1 tablet Oral BID WC  . cholecalciferol  1,000 Units Oral Daily  . enoxaparin (LOVENOX)  injection  40 mg Subcutaneous Daily  . escitalopram  5 mg Oral Q breakfast  . finasteride  5 mg Oral Daily  .  HYDROmorphone (DILAUDID) injection  0.5 mg Intravenous Once  . LORazepam  0.5 mg Intravenous Once  . vitamin C  500 mg Oral Daily   Continuous Infusions:   Principal Problem:   TIA (transient ischemic attack) Active Problems:   Dysarthria   Acute encephalopathy  Time spent: [place], MD [facility] Pager [phone]. If 7 PM - 7 AM, please  contact night-coverage at [email or web address], password [facility] 04/17/2013, 10:18 AM  LOS: 3 days

## 2013-04-17 NOTE — Consult Note (Signed)
Patient SW:NIOEVO Jermaine Lee      DOB: 11-30-1921      JJK:[id number]     Consult Note from the Palliative Medicine Team at [facility] Requested by: Dr Cruzita Lederer     PCP: Geoffery Lyons, MD Reason for Consultation: Clarification of [place] and options     Phone Number:[phone]  Assessment of patients Current state: Jermaine Lee is an 80 y.o. male history Parkinson's disease, hypertension, h/o traumatic subarachnoid and subdural hemorrhage and memory loss, brought to the emergency room by family following multiple episodes of transient slurred speech for the past couple days. Initial symptoms occurred on 04/12/2013. No focal weakness was noted, including no facial droop. Symptoms resolved in each instance. New diagnosis of CHF. H/O of  PD with continued physical and functional and cognitive decline over the past year.  Poor po intake with reported significant weight loss.  Family in process of making decsions related to advanced directives within context of present medical situation.  This NP Wadie Lessen reviewed medical records, received report from team, assessed the patient and then meet at the patient's bedside along with his wife Gigi Gin # [phone], his two sons, De Burrs C# [phone] and Warren Danes  to discuss diagnosis prognosis, GOC, EOL wishes disposition and options.   A detailed discussion was had today regarding advanced directives.  Concepts specific to code status, artifical feeding and hydration, continued IV antibiotics and rehospitalization was had.  The difference between a aggressive medical intervention path  and a palliative comfort care path for this patient at this time was had.  Values and goals of care important to patient and family were attempted to be elicited.  Concept of Hospice and Palliative Care were discussed  Natural trajectory and expectations at EOL were discussed.  Questions and concerns addressed.  Hard Choices booklet left for  review. Family encouraged to call with questions or concerns.  PMT will continue to support holistically.  MOST form discussed in detail and blank form left with family.    Goals of Care: 1.  Code Status: DNR/DNI   2. Scope of Treatment: 1. Vital Signs: per unit  2. Respiratory/Oxygen: as needed  3. Nutritional Support/Tube Feeds: no artificial feeding now or in the future 4. Antibiotics: as needed for treatment 5. IVF: as needed for treatment  6. Review of Medications to be discontinued: Continued  7. Labs:yes   4. Disposition:  Dependant on outcomes over the next few days.   Detailed discussion regarding of options of SNF/Rehab vs SNF/Long term Care vs home with hospice vs residential hospice.   3. Symptom Management:   1. Weakness/Failure to thrive:        -Continue with present medical interventions, family is hopeful for improveemnt       -Continue PT/OT as tolerated 2.  Dysphagia: never artificial feeds, diet as tolerated  4. Psychosocial:  Emotional support to all. Wife verbalizes clear understanding that her husbands body "is simple tired out" and that comfort is a priority.  PMT will continue to f/u and help family navigate treatments and options    Patient Documents Completed or Given: Document Given Completed  Advanced Directives Pkt    MOST yes   DNR    Gone from My Sight    Hard Choices yes     Brief WCH:ENIDPO Jermaine Jermaine Lee is an 80 y.o. male history Parkinson's disease, hypertension, h/o traumatic subarachnoid and subdural hemorrhage and memory loss, brought to the  emergency room by family following multiple episodes of transient slurred speech for the past couple days. Initial symptoms occurred on 04/12/2013. No focal weakness was noted, including no facial droop. Symptoms resolved in each instance. New diagnosis of CHF. H/O of  PD with continued physical and functional and cognitive decline over the past year.  Poor po intake with reported significant weight  loss.    ZDG:UYQIHK to illicit from patient, letahrgic     PMH:  Past Medical History  Diagnosis Date  . Parkinson's disease   . Essential hypertension, benign   . Mixed hyperlipidemia   . Prostate cancer   . Recurrent falls   . H/O subarachnoid hemorrhage   . GERD (gastroesophageal reflux disease)   . Orthostatic hypotension      PSH: Past Surgical History  Procedure Laterality Date  . Abdominal surgery    . Back surgery    . Appendectomy     I have reviewed the FH and SH and  If appropriate update it with new information. Allergies  Allergen Reactions  . Penicillins Hives  . Sulfa Antibiotics Hives   Scheduled Meds: . aspirin  300 mg Rectal Daily   Or  . aspirin  325 mg Oral Daily  . benazepril  10 mg Oral Daily  . carbidopa-levodopa  0.5 tablet Oral QPM  . carbidopa-levodopa  1 tablet Oral BID WC  . cholecalciferol  1,000 Units Oral Daily  . enoxaparin (LOVENOX) injection  40 mg Subcutaneous Daily  . escitalopram  5 mg Oral Q breakfast  . finasteride  5 mg Oral Daily  .  HYDROmorphone (DILAUDID) injection  0.5 mg Intravenous Once  . levofloxacin (LEVAQUIN) IV  500 mg Intravenous Q24H  . LORazepam  0.5 mg Intravenous Once  . vitamin C  500 mg Oral Daily   Continuous Infusions:  PRN Meds:.    BP 96/41  Pulse 62  Temp(Src) 99 F (37.2 C) (Axillary)  Resp 16  Ht 5\' 11"  (1.803 m)  Wt 62.914 kg (138 lb 11.2 oz)  BMI 19.35 kg/m2  SpO2 96%   PPS:30 % at best   Intake/Output Summary (Last 24 hours) at 04/17/13 1545 Last data filed at 04/17/13 0800  Gross per 24 hour  Intake    270 ml  Output    600 ml  Net   -330 ml    Physical Exam:  General: frail, lethargic, NAD HEENT:  Mm, no exudate Chest:   Diminished in bases, shallow breathing pattern, CTA CVS: RRR Abdomen: soft NT +BS Ext: without edema Neuro: lethargic, weak able to move all four extremities   Labs: CBC    Component Value Date/Time   WBC 4.9 04/15/2013 0500   RBC 3.73*  04/15/2013 0500   HGB 11.2* 04/15/2013 0500   HCT 34.3* 04/15/2013 0500   PLT 128* 04/15/2013 0500   MCV 92.0 04/15/2013 0500   MCH 30.0 04/15/2013 0500   MCHC 32.7 04/15/2013 0500   RDW 13.5 04/15/2013 0500   LYMPHSABS 1.0 04/15/2013 0500   MONOABS 0.6 04/15/2013 0500   EOSABS 0.1 04/15/2013 0500   BASOSABS 0.0 04/15/2013 0500    BMET    Component Value Date/Time   NA 141 04/15/2013 0550   K 4.0 04/15/2013 0550   CL 104 04/15/2013 0550   CO2 25 04/15/2013 0550   GLUCOSE 81 04/15/2013 0550   BUN 29* 04/15/2013 0550   CREATININE 0.88 04/15/2013 0550   CALCIUM 8.5 04/15/2013 0550   GFRNONAA 74* 04/15/2013 0550  GFRAA 86* 04/15/2013 0550    CMP     Component Value Date/Time   NA 141 04/15/2013 0550   K 4.0 04/15/2013 0550   CL 104 04/15/2013 0550   CO2 25 04/15/2013 0550   GLUCOSE 81 04/15/2013 0550   BUN 29* 04/15/2013 0550   CREATININE 0.88 04/15/2013 0550   CALCIUM 8.5 04/15/2013 0550   PROT 6.1 04/15/2013 0550   ALBUMIN 3.4* 04/15/2013 0550   AST 11 04/15/2013 0550   ALT 5 04/15/2013 0550   ALKPHOS 50 04/15/2013 0550   BILITOT 0.6 04/15/2013 0550   GFRNONAA 74* 04/15/2013 0550   GFRAA 86* 04/15/2013 0550       Time In Time Out Total Time Spent with Patient Total Overall Time  1430 1600  80 min 90 min    Greater than 50%  of this time was spent counseling and coordinating care related to the above assessment and plan.  Wadie Lessen NP  Palliative Medicine Team Team Phone # [phone] Pager [phone]   Discussed with Dr Cruzita Lederer

## 2013-04-17 NOTE — Progress Notes (Signed)
Note plans for SNF placement. Nothing further to add from stroke standpoint. Stroke Team will sign off. Please call if needed.  Burnetta Sabin, MSN, RN, ANVP-BC, ANP-BC, [facility] Pager: [phone] 04/17/2013 8:22 AM  I have personally obtained a history, examined the patient, evaluated imaging results, and formulated the assessment and plan of care. I agree with the above.

## 2013-04-18 DIAGNOSIS — Z515 Encounter for palliative care: Secondary | ICD-10-CM

## 2013-04-18 LAB — BASIC METABOLIC PANEL
BUN: 21 mg/dL (ref 6–23)
CALCIUM: 8.6 mg/dL (ref 8.4–10.5)
CO2: 22 mEq/L (ref 19–32)
Chloride: 102 mEq/L (ref 96–112)
Creatinine, Ser: 0.97 mg/dL (ref 0.50–1.35)
GFR, EST AFRICAN AMERICAN: 82 mL/min — AB (ref >90)
GFR, EST NON AFRICAN AMERICAN: 71 mL/min — AB (ref >90)
Glucose, Bld: 92 mg/dL (ref 70–99)
POTASSIUM: 4 meq/L (ref 3.7–5.3)
SODIUM: 140 meq/L (ref 137–147)

## 2013-04-18 LAB — GLUCOSE, CAPILLARY
GLUCOSE-CAPILLARY: 84 mg/dL (ref 70–99)
GLUCOSE-CAPILLARY: 111 mg/dL — AB (ref 70–99)
Glucose-Capillary: 81 mg/dL (ref 70–99)
Glucose-Capillary: 105 mg/dL — ABNORMAL HIGH (ref 70–99)

## 2013-04-18 LAB — CBC
HCT: 33.5 % — ABNORMAL LOW (ref 39.0–52.0)
HEMOGLOBIN: 11.3 g/dL — AB (ref 13.0–17.0)
MCH: 30.5 pg (ref 26.0–34.0)
MCHC: 33.7 g/dL (ref 30.0–36.0)
MCV: 90.5 fL (ref 78.0–100.0)
PLATELETS: 111 10*3/uL — AB (ref 150–400)
RBC: 3.7 MIL/uL — AB (ref 4.22–5.81)
RDW: 13.3 % (ref 11.5–15.5)
WBC: 3.6 10*3/uL — ABNORMAL LOW (ref 4.0–10.5)

## 2013-04-18 MED ORDER — LEVOFLOXACIN 500 MG PO TABS: 500.0000 mg | ORAL_TABLET | Freq: Every day | ORAL | Status: AC

## 2013-04-18 MED ORDER — ACETAMINOPHEN 325 MG PO TABS
650.0000 mg | ORAL_TABLET | ORAL | Status: DC | PRN
  Administered 2013-04-18: 650 mg via ORAL
  Filled 2013-04-18: qty 2

## 2013-04-18 MED ORDER — LEVOFLOXACIN 500 MG PO TABS
500.0000 mg | ORAL_TABLET | Freq: Every day | ORAL | Status: DC
  Administered 2013-04-19 – 2013-04-21 (×3): 500 mg via ORAL
  Filled 2013-04-18 (×3): qty 1

## 2013-04-18 NOTE — Progress Notes (Signed)
Patient positioned from bed to chair, 2+ assist.  PT to see patient later this afternoon for therapy.  Will continue. [place], Ardeth Sportsman

## 2013-04-18 NOTE — Clinical Social Work Placement (Addendum)
Clinical Social Work Department  CLINICAL SOCIAL WORK PLACEMENT NOTE  04/18/2013  Patient: Jermaine BEVERLIN JrAccount Number: [id number] Admit date: 04/14/13 Clinical Social Worker: Rhea Pink, MSW, LCSWADate/time: 04/18/2013 12:00 N  Clinical Social Work is seeking post-discharge placement for this patient at the following level of care: SKILLED NURSING (*CSW will update this form in Epic as items are completed)  04/18/2013 Patient/family provided with [place] Department of Clinical Social Work's list of facilities offering this level of care within the geographic area requested by the patient (or if unable, by the patient's family).  04/18/2013 Patient/family informed of their freedom to choose among providers that offer the needed level of care, that participate in Medicare, Medicaid or managed care program needed by the patient, have an available bed and are willing to accept the patient.  04/18/2013 Patient/family informed of [facility]' ownership interest in [facility], as well as of the fact that they are under no obligation to receive care at this facility.  PASARR submitted to EDS on  04/18/2013 PASARR number received from EDS on 04/18/2013 FL2 transmitted to all facilities in geographic area requested by pt/family on 04/18/2013  FL2 transmitted to all facilities within larger geographic area on  Patient informed that his/her managed care company has contracts with or will negotiate with certain facilities, including the following:  Patient/family informed of bed offers received:  Patient chooses bed at [facility] Physician recommends and patient chooses bed at  Patient to be transferred to on 04/21/13 Patient to be transferred to facility by [facility] The following physician request were entered in Epic:  Additional Comments:  Rhea Pink, MSW, [place]

## 2013-04-18 NOTE — Discharge Summary (Signed)
Physician Discharge Summary  Jermaine Lee [id number] DOB: 07-Nov-1921 DOA: 04/14/2013  PCP: Geoffery Lyons, MD  Admit date: 04/14/2013 Discharge date: 04/21/2013  Time spent: 35 minutes  Recommendations for Outpatient Follow-up:  1. Follow up with PCP in 1-2 weeks  2. Continue Levofloxacin until 04/25/2013  Recommendations for primary care physician for things to follow:  Post hospital follow up  Discharge Diagnoses:  Principal Problem:   TIA (transient ischemic attack) Active Problems:   Dysarthria   Acute encephalopathy   Secondary cardiomyopathy, unspecified   Dysphagia, unspecified(787.20)   Weakness generalized   Palliative care encounter  Discharge Condition: stable  Diet recommendation: heart healthy  Filed Weights   04/18/13 0419 04/18/13 2355 04/19/13 0400  Weight: 65.454 kg (144 lb 4.8 oz) 64.5 kg (142 lb 3.2 oz) 64.5 kg (142 lb 3.2 oz)   History of present illness:  Jermaine Lee is a 79 y.o. male with Past medical history of Parkinson's disease, hypertension, bradycardia, subarachnoid hemorrhage, dementia. The patient is coming from home. The patient is not providing any history therefore history has been obtained from ED ED documentation and EMS documentation. The patient was initially brought in because of garbled speech that has been ongoing since Saturday and Sunday this was also sedated with shuffling gait. But the family his primary concern was he has been sleeping more than his usual. There were no fall or trauma reported.  The family did not report any other focal deficit.  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 Cardiology consulted, appreciate input. Conservative management for now and his medications optimized as per below.  UTI  - UA not impressive on admission, however urine cultures positive. Given more altered and low grade temp, started Levofloxacin 04/17/13.  Patient remained afebrile since.  Cough  - mild cough started 04/17/13, empiric Levofloxacin for #2 seems to help. Continue Levofloxacin for 10 days until 04/25/13. CXR without evidence of Pneumonia.  - influenza panel negative.  Parkinson's disease  - with progressive decline over the past few months, decline in his mental status here in the hospital.  - palliative medicine consulted, patient now DNR/DNI TIA / transient slurred speech  - Neurology has been consulted, appreciate assistance, no stroke per MRI, signed off.  - MRI without infarction, MRA without major vessel occlusion.  - 2D echo done, EF [id number], diastolic dysfunction grade 2 present. Cards consult as above.  - EEG with discharge concerning for an epileptiform nature in the bi-occipital region. Per Neurology, this would not explain his current symptoms and will not start AED now; recommended follow up EEG as an outpatient.  - carotid duplex with no significant stenosis HTN - continue home medications  HLD  BPH   Procedures:  2D echo Impressions: - Technically difficult study with poor acoustic windows. LV systolic function is probably at least mildly decreased with estimate EF 40-45%. The RV is poorly visualized but probably normal. Mild AI. Diffuse hypokinesis. Features are consistent with a pseudonormal left ventricular filling pattern, with concomitant abnormal relaxation and increased filling pressure (grade 2 diastolic dysfunction).  Consultations:  Cardiology  Neurology  Palliative  Discharge Exam: Filed Vitals:   04/19/13 2100 04/20/13 2100 04/21/13 0500 04/21/13 0609  BP: 162/67 165/56 155/58   Pulse: 72 56 57   Temp: 97.6 F (36.4 C) 98.3 F (36.8 C) 98.2 F (36.8 C)   TempSrc:      Resp: 20 18  16   Height:      Weight:      SpO2: 99% 92% 91% 94%   General: NAD Cardiovascular: RRR Respiratory: CTA biL  Discharge Instructions       Future Appointments Provider Department Dept Phone   08/14/2013  12:00 PM Star Age, MD [facility] [phone]       Medication List         benazepril 10 MG tablet  Commonly known as:  LOTENSIN  Take 1 tablet (10 mg total) by mouth daily.     busPIRone 15 MG tablet  Commonly known as:  BUSPAR  Take 15 mg by mouth 2 (two) times daily as needed (for anxiety).     carbidopa-levodopa 25-100 MG per tablet  Commonly known as:  SINEMET IR  - Take 0.5-1 tablets by mouth 3 (three) times daily. 1 tablet in the morning  - 1 tablet at noon  - 0.5 tablet at night     cholecalciferol 1000 UNITS tablet  Commonly known as:  VITAMIN D  Take 1,000 Units by mouth daily.     escitalopram 5 MG tablet  Commonly known as:  LEXAPRO  Take 1 tablet (5 mg total) by mouth daily with breakfast.     finasteride 5 MG tablet  Commonly known as:  PROSCAR  Take 5 mg by mouth daily.     levofloxacin 500 MG tablet  Commonly known as:  LEVAQUIN  Take 1 tablet (500 mg total) by mouth daily.     vitamin C 500 MG tablet  Commonly known as:  ASCORBIC ACID  Take 500 mg by mouth daily.       Follow-up Information   Follow up with Star Age, MD. Schedule an appointment as soon as possible for a visit in 1 month. (Parkinsons Disease)    Specialties:  Neurology, Radiology   Contact information:   [address] [place] [address] [phone]      The results of significant diagnostics from this hospitalization (including imaging, microbiology, ancillary and laboratory) are listed below for reference.    Significant Diagnostic Studies: Dg Chest 2 View  04/16/2013   CLINICAL DATA:  Weakness, recent stroke  EXAM: CHEST  2 VIEW  COMPARISON:  DG CHEST 2 VIEW dated 04/14/2013; DG RIBS UNILATERAL W/CHEST*R* dated 10/11/2011; CT ABD/PELVIS W CM dated 03/31/2011  FINDINGS: Stable cardiac silhouette with ectatic aorta. No effusion, infiltrate, or pneumothorax. No acute osseous abnormality.  IMPRESSION: No acute cardiopulmonary  process.   Electronically Signed   By: Suzy Bouchard M.D.   On: 04/16/2013 00:13   Dg Chest 2 View  04/14/2013   CLINICAL DATA:  Fatigue  EXAM: CHEST  2 VIEW  COMPARISON:  10/11/2011; 02/05/2008  FINDINGS: Grossly unchanged cardiac silhouette and mediastinal contours with tortuosity and possible mild ectasia of the thoracic aorta. Atherosclerotic plaque within the thoracic aorta. No focal airspace opacities. No pleural effusion or pneumothorax. No definite evidence of edema. No acute osseus abnormalities.  IMPRESSION: No acute cardiopulmonary disease.   Electronically Signed   By: Sandi Mariscal M.D.   On: 04/14/2013 20:30   Ct Head Wo Contrast  04/15/2013   CLINICAL DATA:  Change in mental status with decreased responsiveness. Increasing sleeping.  EXAM: CT HEAD WITHOUT CONTRAST  TECHNIQUE: Contiguous axial images were obtained from the base of the skull through the vertex without intravenous contrast.  COMPARISON:  04/14/2013  FINDINGS: Diffuse cerebral atrophy. Low-attenuation changes in the deep white matter consistent with  small vessel ischemic change. No mass effect or midline shift. No abnormal extra-axial fluid collections. Gray-white matter junctions are distinct. Basal cisterns are not effaced. No evidence of acute intracranial hemorrhage. No depressed skull fractures. Partial opacification of the sphenoid sinuses with suggestion an air-fluid level. Mucosal thickening in the ethmoid air cells and maxillary antra. Stable appearance since previous study.  IMPRESSION: No acute intracranial abnormalities. No significant change since yesterday.   Electronically Signed   By: Lucienne Capers M.D.   On: 04/15/2013 03:25   Ct Head Wo Contrast  04/14/2013   CLINICAL DATA:  EMS reports family st's pt had garbled speech Sat and Sun but resolved. St's he had garbled speech earlier today as well. Also st's pt has shuffling gait and sleeping more than usual.  EXAM: CT HEAD WITHOUT CONTRAST  TECHNIQUE:  Contiguous axial images were obtained from the base of the skull through the vertex without intravenous contrast.  COMPARISON:  09/09/2012  FINDINGS: There is no evidence of mass effect, midline shift, or extra-axial fluid collections. There is no evidence of a space-occupying lesion or intracranial hemorrhage. There is no evidence of a cortical-based area of acute infarction. There is generalized cerebral atrophy. There is periventricular white matter low attenuation likely secondary to microangiopathy.  The ventricles and sulci are appropriate for the patient's age. The basal cisterns are patent.  Visualized portions of the orbits are unremarkable. There is bilateral sphenoid sinus and right maxillary sinus mucosal thickening. There is mild ethmoid sinus mucosal thickening. There is a left sphenoid sinus air-fluid level. Cerebrovascular atherosclerotic calcifications are noted.  The osseous structures are unremarkable.  IMPRESSION: No acute intracranial pathology.   Electronically Signed   By: Kathreen Devoid   On: 04/14/2013 18:34   Mr Brain Wo Contrast  04/16/2013   CLINICAL DATA:  Altered mental status  EXAM: MRI HEAD WITHOUT CONTRAST  MRA HEAD WITHOUT CONTRAST  TECHNIQUE: Multiplanar, multiecho pulse sequences of the brain and surrounding structures were obtained without intravenous contrast. Angiographic images of the head were obtained using MRA technique without contrast.  COMPARISON:  Head CT 04/15/2013.  MRI 02/06/2008.  FINDINGS: MRI HEAD FINDINGS  Diffusion imaging does not show any acute or subacute infarction. Brain shows mild generalized atrophy consistent with age. There is not a pattern of small vessel disease. No focal small-vessel or large vessel insults of any age. No mass lesion, hemorrhage, hydrocephalus or extra-axial collection. No pituitary mass. There is some inflammatory change of the paranasal sinuses most pronounced in the sphenoid sinus.  MRA HEAD FINDINGS  Both internal carotid  arteries are widely patent into the brain. No siphon stenosis. The anterior and middle cerebral vessels are patent without proximal stenosis, aneurysm or vascular malformation. 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  The left vertebral artery is the dominant vessel which is widely patent to the basilar. Right vertebral artery is nondominant and largely terminates in the posterior inferior cerebellar artery. This segment of the vertebral artery between PICA and the basilar is small and irregular. This could be congenital or reflect a diseased segment of vessel. There is no basilar stenosis. Superior cerebellar and posterior cerebral vessels are patent.  IMPRESSION: MRI brain: Remarkably normal exam for a person of this age. Mild age related atrophy without evidence of old or acute small or large vessel infarction. Some inflammatory changes of the sinuses, most pronounced in the sphenoid sinus.  Intracranial MRA: No major vessel occlusion. Anterior circulation vessels widely patent. Right vertebral artery diminutive  between the posterior inferior cerebellar artery and the basilar. This could be congenital or reflect a diseased segment of vessel.   Electronically Signed   By: Nelson Chimes M.D.   On: 04/16/2013 08:59   Dg Chest Port 1 View  04/17/2013   CLINICAL DATA:  Cough  EXAM: PORTABLE CHEST - 1 VIEW  COMPARISON:  04/15/2013  FINDINGS: Normal mediastinum and cardiac silhouette. Normal pulmonary vasculature. No evidence of effusion, infiltrate, or pneumothorax. No acute bony abnormality.  IMPRESSION: No acute cardiopulmonary process.   Electronically Signed   By: Suzy Bouchard M.D.   On: 04/17/2013 14:29   Mr Jodene Nam Head/brain Wo Cm  04/16/2013   CLINICAL DATA:  Altered mental status  EXAM: MRI HEAD WITHOUT CONTRAST  MRA HEAD WITHOUT CONTRAST  TECHNIQUE: Multiplanar, multiecho pulse sequences of the brain and surrounding structures were obtained without intravenous contrast. Angiographic images of  the head were obtained using MRA technique without contrast.  COMPARISON:  Head CT 04/15/2013.  MRI 02/06/2008.  FINDINGS: MRI HEAD FINDINGS  Diffusion imaging does not show any acute or subacute infarction. Brain shows mild generalized atrophy consistent with age. There is not a pattern of small vessel disease. No focal small-vessel or large vessel insults of any age. No mass lesion, hemorrhage, hydrocephalus or extra-axial collection. No pituitary mass. There is some inflammatory change of the paranasal sinuses most pronounced in the sphenoid sinus.  MRA HEAD FINDINGS  Both internal carotid arteries are widely patent into the brain. No siphon stenosis. The anterior and middle cerebral vessels are patent without proximal stenosis, aneurysm or vascular malformation. 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  The left vertebral artery is the dominant vessel which is widely patent to the basilar. Right vertebral artery is nondominant and largely terminates in the posterior inferior cerebellar artery. This segment of the vertebral artery between PICA and the basilar is small and irregular. This could be congenital or reflect a diseased segment of vessel. There is no basilar stenosis. Superior cerebellar and posterior cerebral vessels are patent.  IMPRESSION: MRI brain: Remarkably normal exam for a person of this age. Mild age related atrophy without evidence of old or acute small or large vessel infarction. Some inflammatory changes of the sinuses, most pronounced in the sphenoid sinus.  Intracranial MRA: No major vessel occlusion. Anterior circulation vessels widely patent. Right vertebral artery diminutive between the posterior inferior cerebellar artery and the basilar. This could be congenital or reflect a diseased segment of vessel.   Electronically Signed   By: Nelson Chimes M.D.   On: 04/16/2013 08:59   Microbiology: Recent Results (from the past 240 hour(s))  URINE CULTURE     Status: None    Collection Time    04/14/13  7:45 PM      Result Value Range Status   Specimen Description URINE, RANDOM   Final   Special Requests NONE   Final   Culture  Setup Time     Final   Value: 04/14/2013 21:46     Performed at [facility]     Final   Value: 70,000 COLONIES/ML     Performed at [facility]   Culture     Final   Value: PROTEUS MIRABILIS     Performed at [facility]   Report Status 04/17/2013 FINAL   Final   Organism ID, Bacteria PROTEUS MIRABILIS   Final  CLOSTRIDIUM DIFFICILE BY PCR     Status: None  Collection Time    04/15/13  5:22 PM      Result Value Range Status   C difficile by pcr NEGATIVE  NEGATIVE Final  CULTURE, BLOOD (ROUTINE X 2)     Status: None   Collection Time    04/19/13 12:25 AM      Result Value Range Status   Specimen Description BLOOD RIGHT FOREARM   Final   Special Requests BOTTLES DRAWN AEROBIC AND ANAEROBIC 10CC EACH   Final   Culture  Setup Time     Final   Value: 04/19/2013 14:59     Performed at [facility]   Culture     Final   Value:        BLOOD CULTURE RECEIVED NO GROWTH TO DATE CULTURE WILL BE HELD FOR 5 DAYS BEFORE ISSUING A FINAL NEGATIVE REPORT     Performed at [facility]   Report Status PENDING   Incomplete  CULTURE, BLOOD (ROUTINE X 2)     Status: None   Collection Time    04/19/13 12:30 AM      Result Value Range Status   Specimen Description BLOOD RIGHT ARM   Final   Special Requests     Final   Value: BOTTLES DRAWN AEROBIC AND ANAEROBIC 10CC AEROBIC [place] ANAEROBIC   Culture  Setup Time     Final   Value: 04/19/2013 14:59     Performed at [facility]   Culture     Final   Value:        BLOOD CULTURE RECEIVED NO GROWTH TO DATE CULTURE WILL BE HELD FOR 5 DAYS BEFORE ISSUING A FINAL NEGATIVE REPORT     Performed at [facility]   Report Status PENDING   Incomplete    Labs: Basic Metabolic Panel:  Recent Labs Lab 04/14/13 1840 04/15/13 0550  04/18/13 0714  NA 143 141 140  K 4.2 4.0 4.0  CL 106 104 102  CO2 28 25 22   GLUCOSE 98 81 92  BUN 32* 29* 21  CREATININE 1.01 0.88 0.97  CALCIUM 8.8 8.5 8.6   Liver Function Tests:  Recent Labs Lab 04/15/13 0550  AST 11  ALT 5  ALKPHOS 50  BILITOT 0.6  PROT 6.1  ALBUMIN 3.4*   CBC:  Recent Labs Lab 04/14/13 1840 04/15/13 0500 04/18/13 0714  WBC 5.6 4.9 3.6*  NEUTROABS 3.8 3.2  --   HGB 11.5* 11.2* 11.3*  HCT 35.8* 34.3* 33.5*  MCV 92.5 92.0 90.5  PLT 143* 128* 111*   CBG:  Recent Labs Lab 04/19/13 2053 04/20/13 0835 04/20/13 1144 04/20/13 1650 04/20/13 2128  GLUCAP 90 110* 92 102* 111*   Signed:  GHERGHE, COSTIN  [facility] 04/21/2013, 6:48 AM

## 2013-04-18 NOTE — Progress Notes (Signed)
Read, reviewed, edited and agree with student's findings and recommendations.  Solana Coggin B. Farooq Petrovich, PT, DPT #[phone]  

## 2013-04-18 NOTE — Clinical Social Work Psychosocial (Signed)
Clinical Social Work Department  BRIEF PSYCHOSOCIAL ASSESSMENT  Patient: Jermaine Lee  Account Number: [id number]  Admit date: 04/14/13 Clinical Social Worker Rhea Pink, MSW Date/Time: 04/18/2013 11:00 AM Referred by: Physician Date Referred:  Referred for   SNF Placement   Other Referral:  Interview type: Patient and patient's wife and sons Other interview type: PSYCHOSOCIAL DATA  Living Status: Wife Admitted from facility:  Level of care:  Primary support name: [facility] Primary support relationship to patient: Spouse Degree of support available:  Strong and vested  CURRENT CONCERNS  Current Concerns   Post-Acute Placement   Other Concerns:  SOCIAL WORK ASSESSMENT / PLAN  CSW met with pt re: PT recommendation for SNF.   Pt lives with his spouse   CSW explained placement process and answered questions.   Pt reports [facility]  as their preference    CSW completed FL2 and initiated SNF search.     Assessment/plan status: Information/Referral to [employer]  Other assessment/ plan:  Information/referral to community resources:  SNF   PTAR  PATIENT'S/FAMILY'S RESPONSE TO PLAN OF CARE:  Pt  reports he is agreeable to ST SNF in order to increase strength and independence with mobility prior to returning home  Pt verbalized understanding of placement process and appreciation for CSW assist.   Rhea Pink, MSW, [place]

## 2013-04-18 NOTE — Progress Notes (Signed)
Patient ID: Jermaine Lee, male   DOB: June 16, 1921, 80 y.o.   MRN: [id number]    Subjective:  Does not verbally answer back questions   Objective:  Filed Vitals:   04/17/13 1600 04/17/13 2018 04/17/13 2357 04/18/13 0419  BP: 124/54 128/47 131/54 137/47  Pulse: 66 71 65 60  Temp: 98 F (36.7 C) 99 F (37.2 C) 97.9 F (36.6 C) 99 F (37.2 C)  TempSrc: Axillary Axillary Axillary Axillary  Resp: 16 16 16 16   Height:      Weight:    144 lb 4.8 oz (65.454 kg)  SpO2: 97%  96% 95%    Intake/Output from previous day:  Intake/Output Summary (Last 24 hours) at 04/18/13 6295 Last data filed at 04/18/13 0300  Gross per 24 hour  Intake    115 ml  Output    400 ml  Net   -285 ml    Physical Exam: Frail elderly male, no distress.  HEENT: Conjunctiva and lids normal, oropharynx clear.  Neck: Supple, no elevated JVP or carotid bruits, no thyromegaly.  Lungs: Clear to auscultation with a few scattered rhonchi, nonlabored breathing at rest.  Cardiac: Regular rate and rhythm, no S3 or significant systolic murmur, no pericardial rub.  Abdomen: Soft, nontender, bowel sounds present.  Extremities: No pitting edema, distal pulses 1-2+.  Skin: Warm and dry.  Musculoskeletal: No kyphosis.  Neuropsychiatric: Does not answer questions, making only indistinct verbalizations, increased tone with mild diffuse weakness.   CBC:  Recent Labs  04/18/13 0714  WBC 3.6*  HGB 11.3*  HCT 33.5*  MCV 90.5  PLT PENDING    Imaging: Dg Chest Port 1 View  04/17/2013   CLINICAL DATA:  Cough  EXAM: PORTABLE CHEST - 1 VIEW  COMPARISON:  04/15/2013  FINDINGS: Normal mediastinum and cardiac silhouette. Normal pulmonary vasculature. No evidence of effusion, infiltrate, or pneumothorax. No acute bony abnormality.  IMPRESSION: No acute cardiopulmonary process.   Electronically Signed   By: Suzy Bouchard M.D.   On: 04/17/2013 14:29    Cardiac Studies:  ECG:  SR rate 60 IVCD    Telemetry:  NSR no  arrhythmia  04/18/2013   Echo:  EF 40-45%  No source of embolus   Medications:   . aspirin  300 mg Rectal Daily   Or  . aspirin  325 mg Oral Daily  . benazepril  10 mg Oral Daily  . carbidopa-levodopa  0.5 tablet Oral QPM  . carbidopa-levodopa  1 tablet Oral BID WC  . cholecalciferol  1,000 Units Oral Daily  . enoxaparin (LOVENOX) injection  40 mg Subcutaneous Daily  . escitalopram  5 mg Oral Q breakfast  . finasteride  5 mg Oral Daily  .  HYDROmorphone (DILAUDID) injection  0.5 mg Intravenous Once  . levofloxacin (LEVAQUIN) IV  500 mg Intravenous Q24H  . LORazepam  0.5 mg Intravenous Once  . vitamin C  500 mg Oral Daily       Assessment/Plan:  DCM:  See consult note by Dr Domenic Polite  Conservative Rx  Continue ACE  No further w/u needed from our perspective Will sign off    Jenkins Rouge 04/18/2013, 8:32 AM

## 2013-04-18 NOTE — Progress Notes (Signed)
Physical Therapy Treatment Patient Details Name: Raiden Haydu MRN: [id number] DOB: 1921/05/16 Today's Date: 04/18/2013 Time: 0623-7628 PT Time Calculation (min): 40 min  PT Assessment / Plan / Recommendation  History of Present Illness Tracey Stewart is an 80 y.o. male history Parkinson's disease, hypertension, traumatic subarachnoid and subdural hemorrhage and memory loss, brought to the emergency room 04/14/2013 by family following multiple episodes of transient slurred speech for the past couple days.   PT Comments   Patient is extremely fatigued and confused this PM.  He responds much better to activity in the AM, so consider this with next visit.  He was unable to stand this session, likely due to confounding factors of fatigue from the day and since the chair is lower than the bed which he stood from previously.  He has significant extensor tone throughout LE this AM that prevent him from standing even with 3 people's assistance.  He continues to be afraid to fall out of the chair since his legs are extended and he feels unsteady, even with frequent reassurance from PT that this will not happen.  His son is present in session, and seems to respond better to sons voice since he is familiar with it.  MaxiMove used to transfer to bed.    Follow Up Recommendations  SNF           Equipment Recommendations  None recommended by PT       Frequency Min 3X/week   Progress towards PT Goals Progress towards PT goals: Progressing toward goals  Plan Current plan remains appropriate    Precautions / Restrictions Precautions Precautions: Fall Restrictions Weight Bearing Restrictions: No   Pertinent Vitals/Pain None reported    Mobility  Bed Mobility Bed Mobility: Not assessed Transfers Transfers: Sit to Stand Sit to Stand: 1: +2 Total assist;With upper extremity assist;From chair/3-in-1 Sit to Stand: Patient Percentage: 10% Transfer via Lift Equipment: [place] Details for  Transfer Assistance: Unable to achieve standing x 2 attempts, resorted to [facility] to get him to chair.  Patient has significant extensor tone in LE with flexion tone more pronounced in L UE.  Despite multiple attempts with VC and TC to break LE tone, none was successful enough to maintain knee flexion enough to stand.   Ambulation/Gait Ambulation/Gait Assistance: Not tested (comment)      PT Goals (current goals can now be found in the care plan section) Acute Rehab PT Goals Patient Stated Goal: to go home PT Goal Formulation: With patient/family Time For Goal Achievement: 04/22/13 Potential to Achieve Goals: Fair  Visit Information  Last PT Received On: 04/18/13 Assistance Needed: +2 History of Present Illness: Garon Melander is an 80 y.o. male history Parkinson's disease, hypertension, traumatic subarachnoid and subdural hemorrhage and memory loss, brought to the emergency room 04/14/2013 by family following multiple episodes of transient slurred speech for the past couple days.    Subjective Data  Patient Stated Goal: to go home   Cognition  Cognition Arousal/Alertness: Lethargic Behavior During Therapy: Flat affect Overall Cognitive Status: History of cognitive impairments - at baseline Area of Impairment: Memory;Awareness;Problem solving;Attention;Orientation;Safety/judgement Current Attention Level: Sustained Memory: Decreased short-term memory Safety/Judgement: Decreased awareness of deficits;Decreased awareness of safety Awareness: Intellectual Problem Solving: Slow processing;Decreased initiation;Difficulty sequencing;Requires verbal cues;Requires tactile cues General Comments: Soft speaker, responds better when you speak softly to him       End of Session PT - End of Session Equipment Utilized During Treatment: Gait belt Activity Tolerance: Patient  limited by fatigue;Patient limited by lethargy Patient left: with call bell/phone within reach;in bed;with  family/visitor present Nurse Communication: Mobility status   GP    Cordelia Poche, SPT Pager:  [place] 04/18/2013, 6:23 PM

## 2013-04-18 NOTE — Progress Notes (Signed)
Palliative Care Team at [place] Note   SUBJECTIVE: Patient is OOB in chair. Seen by Palliative NP yesterday and prelim GOC established. Wife tells me he is much better today, but on my evaluation he is non-verbal, extremely rigid and unable to work with PT who is at bedside working on getting him back into bed. His PD meds were re-ordered but he did not have them for 2 days.  OBJECTIVE: Vital Signs: BP 167/14  Pulse 73  Temp(Src) 99 F (37.2 C) (Axillary)  Resp 18  Ht 5\' 11"  (1.803 m)  Wt 65.454 kg (144 lb 4.8 oz)  BMI 20.13 kg/m2  SpO2 95%   Intake and Output: 01/01 0701 - 01/02 0700 In: 145 [P.O.:145] Out: 400 [Urine:400]  Physical Exam: General: Vital signs reviewed and noted. Frail, not able to follow commands or speak  Head: Normocephalic, atraumatic.  Lungs:  Normal respiratory effort. +rhonchi  Heart: Normal  Abdomen:  BS normoactive. Soft, Nondistended, non-tender.  No masses or organomegaly.  Extremities: + edema, very rigid, +intention tremors    Allergies  Allergen Reactions  . Penicillins Hives  . Sulfa Antibiotics Hives    Medications: Scheduled Meds:  . aspirin  300 mg Rectal Daily   Or  . aspirin  325 mg Oral Daily  . benazepril  10 mg Oral Daily  . carbidopa-levodopa  0.5 tablet Oral QPM  . carbidopa-levodopa  1 tablet Oral BID WC  . cholecalciferol  1,000 Units Oral Daily  . enoxaparin (LOVENOX) injection  40 mg Subcutaneous Daily  . escitalopram  5 mg Oral Q breakfast  . finasteride  5 mg Oral Daily  .  HYDROmorphone (DILAUDID) injection  0.5 mg Intravenous Once  . [START ON 04/19/2013] levofloxacin  500 mg Oral Daily  . LORazepam  0.5 mg Intravenous Once  . vitamin C  500 mg Oral Daily    Labs: CBC    Component Value Date/Time   WBC 3.6* 04/18/2013 0714   RBC 3.70* 04/18/2013 0714   HGB 11.3* 04/18/2013 0714   HCT 33.5* 04/18/2013 0714   PLT 111* 04/18/2013 0714   MCV 90.5 04/18/2013 0714   MCH 30.5 04/18/2013 0714   MCHC 33.7  04/18/2013 0714   RDW 13.3 04/18/2013 0714   LYMPHSABS 1.0 04/15/2013 0500   MONOABS 0.6 04/15/2013 0500   EOSABS 0.1 04/15/2013 0500   BASOSABS 0.0 04/15/2013 0500    CMET     Component Value Date/Time   NA 140 04/18/2013 0714   K 4.0 04/18/2013 0714   CL 102 04/18/2013 0714   CO2 22 04/18/2013 0714   GLUCOSE 92 04/18/2013 0714   BUN 21 04/18/2013 0714   CREATININE 0.97 04/18/2013 0714   CALCIUM 8.6 04/18/2013 0714   PROT 6.1 04/15/2013 0550   ALBUMIN 3.4* 04/15/2013 0550   AST 11 04/15/2013 0550   ALT 5 04/15/2013 0550   ALKPHOS 50 04/15/2013 0550   BILITOT 0.6 04/15/2013 0550   GFRNONAA 71* 04/18/2013 0714   GFRAA 82* 04/18/2013 0714    ASSESSMENT/ PLAN: 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 Family request that I return over the weekend -wife and sons have reviewed MOST form. They think he is making progress today. Plan is for [facility] SNF for rehab-I would encourage PCS to follow at SNF. He is high risk of readmission and recurrent PNA and complications of his end-stage parkinsons-he has an abnormal EEG, but no AED recommended at this time.   Maintain current level of care  Will re-eval  over the weekend  Ongoing goals of care discussion   25 minutes.Greater than 50%  of this time was spent counseling and coordinating care related to the above assessment and plan.   Acquanetta Chain, DO  04/18/2013, 7:05 PM  Please contact Palliative Medicine Team phone at [phone] for questions and concerns.

## 2013-04-18 NOTE — Progress Notes (Signed)
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 Cardiology consulted, appreciate input. Conservative management for now.  UTI  - UA not impressive on admission, however urine cultures positive. Given more altered and low grade temp, started Levofloxacin 04/17/13. Afebrile today.  Cough  - new today, perhaps related to ?heart failure. Obtain CXR, Levaquin covers. - influenza panel negative. Parkinson's disease - with progressive decline over the past few months, decline in his mental status here in the hospital.  - palliative medicine consulted TIA / transient slurred speech  - Neurology has been consulted, appreciate assistance, no stroke per MRI, signed off.  - MRI without infarction, MRA without major vessel occlusion.  - 2D echo done, EF 19-50%, diastolic dysfunction grade 2 present. Cards consult as above. - EEG with discharge concerning for an epileptiform nature in the bi-occipital region. Per Neurology, this would not explain his current symptoms and will not start AED now; recommended follow up EEG as an outpatient.  - carotid duplex with no significant stenosis per preliminary read HTN - continue home medications HLD  BPH   Diet: regular Fluids: none DVT Prophylaxis: lovenox  Code Status: Full Family Communication: son at bedside  Disposition Plan: inpatient, SNF probably tomorrow.   Consultants:  Neurology  Cardiology  Palliative  Procedures:  2D echo   Antibiotics - Levofloxacin  HPI/Subjective: - more alert this morning  Objective: Filed Vitals:   04/17/13 1600 04/17/13 2018 04/17/13 2357 04/18/13 0419  BP: 124/54 128/47 131/54 137/47  Pulse: 66 71 65 60  Temp: 98 F (36.7 C) 99 F (37.2 C) 97.9 F (36.6 C) 99 F (37.2 C)  TempSrc: Axillary Axillary Axillary Axillary  Resp:  16 16 16 16   Height:      Weight:    65.454 kg (144 lb 4.8 oz)  SpO2: 97%  96% 95%    Intake/Output Summary (Last 24 hours) at 04/18/13 0738 Last data filed at 04/18/13 0300  Gross per 24 hour  Intake    145 ml  Output    400 ml  Net   -255 ml   Filed Weights   04/15/13 0012 04/16/13 0400 04/18/13 0419  Weight: 64.048 kg (141 lb 3.2 oz) 62.914 kg (138 lb 11.2 oz) 65.454 kg (144 lb 4.8 oz)    Exam:  General:  NAD, alert  Cardiovascular: regular rate and rhythm, without MRG  Respiratory: good air movement, clear to auscultation throughout, no wheezing, ronchi or rales  Abdomen: soft, not tender to palpation, positive bowel sounds  MSK: no peripheral edema  Neuro: non focal  Data Reviewed: Basic Metabolic Panel:  Recent Labs Lab 04/14/13 1840 04/15/13 0550  NA 143 141  K 4.2 4.0  CL 106 104  CO2 28 25  GLUCOSE 98 81  BUN 32* 29*  CREATININE 1.01 0.88  CALCIUM 8.8 8.5   Liver Function Tests:  Recent Labs Lab 04/15/13 0550  AST 11  ALT 5  ALKPHOS 50  BILITOT 0.6  PROT 6.1  ALBUMIN 3.4*   CBC:  Recent Labs Lab 04/14/13 1840 04/15/13 0500  WBC 5.6 4.9  NEUTROABS 3.8 3.2  HGB 11.5* 11.2*  HCT 35.8* 34.3*  MCV 92.5 92.0  PLT 143* 128*   CBG:  Recent Labs Lab 04/17/13 0009 04/17/13 0639 04/17/13 1146 04/17/13 1646 04/17/13 2122  GLUCAP 107* 116* 118* 125* 100*  Recent Results (from the past 240 hour(s))  URINE CULTURE     Status: None   Collection Time    04/14/13  7:45 PM      Result Value Range Status   Specimen Description URINE, RANDOM   Final   Special Requests NONE   Final   Culture  Setup Time     Final   Value: 04/14/2013 21:46     Performed at [place]     Final   Value: 70,000 COLONIES/ML     Performed at [facility]   Culture     Final   Value: PROTEUS MIRABILIS     Performed at [facility]   Report Status 04/17/2013 FINAL   Final   Organism ID, Bacteria PROTEUS  MIRABILIS   Final  CLOSTRIDIUM DIFFICILE BY PCR     Status: None   Collection Time    04/15/13  5:22 PM      Result Value Range Status   C difficile by pcr NEGATIVE  NEGATIVE Final     Studies: Mr Brain Wo Contrast  04/16/2013   CLINICAL DATA:  Altered mental status  EXAM: MRI HEAD WITHOUT CONTRAST  MRA HEAD WITHOUT CONTRAST  TECHNIQUE: Multiplanar, multiecho pulse sequences of the brain and surrounding structures were obtained without intravenous contrast. Angiographic images of the head were obtained using MRA technique without contrast.  COMPARISON:  Head CT 04/15/2013.  MRI 02/06/2008.  FINDINGS: MRI HEAD FINDINGS  Diffusion imaging does not show any acute or subacute infarction. Brain shows mild generalized atrophy consistent with age. There is not a pattern of small vessel disease. No focal small-vessel or large vessel insults of any age. No mass lesion, hemorrhage, hydrocephalus or extra-axial collection. No pituitary mass. There is some inflammatory change of the paranasal sinuses most pronounced in the sphenoid sinus.  MRA HEAD FINDINGS  Both internal carotid arteries are widely patent into the brain. No siphon stenosis. The anterior and middle cerebral vessels are patent without proximal stenosis, aneurysm or vascular malformation. 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  The left vertebral artery is the dominant vessel which is widely patent to the basilar. Right vertebral artery is nondominant and largely terminates in the posterior inferior cerebellar artery. This segment of the vertebral artery between PICA and the basilar is small and irregular. This could be congenital or reflect a diseased segment of vessel. There is no basilar stenosis. Superior cerebellar and posterior cerebral vessels are patent.  IMPRESSION: MRI brain: Remarkably normal exam for a person of this age. Mild age related atrophy without evidence of old or acute small or large vessel infarction. Some inflammatory  changes of the sinuses, most pronounced in the sphenoid sinus.  Intracranial MRA: No major vessel occlusion. Anterior circulation vessels widely patent. Right vertebral artery diminutive between the posterior inferior cerebellar artery and the basilar. This could be congenital or reflect a diseased segment of vessel.   Electronically Signed   By: Nelson Chimes M.D.   On: 04/16/2013 08:59   Dg Chest Port 1 View  04/17/2013   CLINICAL DATA:  Cough  EXAM: PORTABLE CHEST - 1 VIEW  COMPARISON:  04/15/2013  FINDINGS: Normal mediastinum and cardiac silhouette. Normal pulmonary vasculature. No evidence of effusion, infiltrate, or pneumothorax. No acute bony abnormality.  IMPRESSION: No acute cardiopulmonary process.   Electronically Signed   By: Suzy Bouchard M.D.   On: 04/17/2013 14:29   Mr Jodene Nam Head/brain Wo Cm  04/16/2013   CLINICAL DATA:  Altered mental status  EXAM: MRI HEAD WITHOUT CONTRAST  MRA HEAD WITHOUT CONTRAST  TECHNIQUE: Multiplanar, multiecho pulse sequences of the brain and surrounding structures were obtained without intravenous contrast. Angiographic images of the head were obtained using MRA technique without contrast.  COMPARISON:  Head CT 04/15/2013.  MRI 02/06/2008.  FINDINGS: MRI HEAD FINDINGS  Diffusion imaging does not show any acute or subacute infarction. Brain shows mild generalized atrophy consistent with age. There is not a pattern of small vessel disease. No focal small-vessel or large vessel insults of any age. No mass lesion, hemorrhage, hydrocephalus or extra-axial collection. No pituitary mass. There is some inflammatory change of the paranasal sinuses most pronounced in the sphenoid sinus.  MRA HEAD FINDINGS  Both internal carotid arteries are widely patent into the brain. No siphon stenosis. The anterior and middle cerebral vessels are patent without proximal stenosis, aneurysm or vascular malformation. 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  The left vertebral artery  is the dominant vessel which is widely patent to the basilar. Right vertebral artery is nondominant and largely terminates in the posterior inferior cerebellar artery. This segment of the vertebral artery between PICA and the basilar is small and irregular. This could be congenital or reflect a diseased segment of vessel. There is no basilar stenosis. Superior cerebellar and posterior cerebral vessels are patent.  IMPRESSION: MRI brain: Remarkably normal exam for a person of this age. Mild age related atrophy without evidence of old or acute small or large vessel infarction. Some inflammatory changes of the sinuses, most pronounced in the sphenoid sinus.  Intracranial MRA: No major vessel occlusion. Anterior circulation vessels widely patent. Right vertebral artery diminutive between the posterior inferior cerebellar artery and the basilar. This could be congenital or reflect a diseased segment of vessel.   Electronically Signed   By: Nelson Chimes M.D.   On: 04/16/2013 08:59    Scheduled Meds: . aspirin  300 mg Rectal Daily   Or  . aspirin  325 mg Oral Daily  . benazepril  10 mg Oral Daily  . carbidopa-levodopa  0.5 tablet Oral QPM  . carbidopa-levodopa  1 tablet Oral BID WC  . cholecalciferol  1,000 Units Oral Daily  . enoxaparin (LOVENOX) injection  40 mg Subcutaneous Daily  . escitalopram  5 mg Oral Q breakfast  . finasteride  5 mg Oral Daily  .  HYDROmorphone (DILAUDID) injection  0.5 mg Intravenous Once  . levofloxacin (LEVAQUIN) IV  500 mg Intravenous Q24H  . LORazepam  0.5 mg Intravenous Once  . vitamin C  500 mg Oral Daily   Continuous Infusions:   Principal Problem:   TIA (transient ischemic attack) Active Problems:   Dysarthria   Acute encephalopathy   Secondary cardiomyopathy, unspecified   Dysphagia, unspecified(787.20)   Weakness generalized   Palliative care encounter  Time spent: [place], MD [facility] Pager [phone]. If 7 PM - 7 AM, please contact  night-coverage at [email or web address], password [facility] 04/18/2013, 7:38 AM  LOS: 4 days

## 2013-04-19 LAB — GLUCOSE, CAPILLARY
GLUCOSE-CAPILLARY: 95 mg/dL (ref 70–99)
GLUCOSE-CAPILLARY: 122 mg/dL — AB (ref 70–99)
GLUCOSE-CAPILLARY: 90 mg/dL (ref 70–99)
Glucose-Capillary: 83 mg/dL (ref 70–99)

## 2013-04-19 NOTE — Progress Notes (Signed)
PROGRESS NOTE  Jermaine Lee VXB:[id number] DOB: July 07, 1921 DOA: 04/14/2013 PCP: Geoffery Lyons, MD  Assessment/Plan: Acute on probably chronic systolic and diastolic heart failure  - per 2D echo, new diagnosis. Cardiology consulted, appreciate input. Conservative management for now.  UTI  - UA not impressive on admission, however urine cultures positive. Given more altered and low grade temp, started Levofloxacin 04/17/13. Afebrile today.  - mental status much improved after antibiotics.  Cough  - continue Levaquin. No pneumonia on CXR.  - influenza panel negative. Parkinson's disease - with progressive decline over the past few months, decline in his mental status here in the hospital.  - palliative medicine consulted TIA / transient slurred speech  - Neurology has been consulted, appreciate assistance, no stroke per MRI, signed off.  - MRI without infarction, MRA without major vessel occlusion.  - 2D echo done, EF 33-00%, diastolic dysfunction grade 2 present. Cards consult as above. - EEG with discharge concerning for an epileptiform nature in the bi-occipital region. Per Neurology, this would not explain his current symptoms and will not start AED now; recommended follow up EEG as an outpatient.  - carotid duplex with no significant stenosis per preliminary read HTN - continue home medications HLD  BPH   Diet: regular Fluids: none DVT Prophylaxis: lovenox  Code Status: Full Family Communication: son at bedside  Disposition Plan: inpatient, SNF probably tomorrow.   Consultants:  Neurology  Cardiology  Palliative  Procedures:  2D echo   Antibiotics - Levofloxacin  HPI/Subjective: - appears to be at baseline this morning per family, sitting in chair and eating grapefruit.   Objective: Filed Vitals:   04/18/13 1630 04/18/13 2000 04/18/13 2355 04/19/13 0400  BP: 167/14  138/59 162/73  Pulse: 73 83 69 54  Temp: 99 F (37.2 C) 99.6 F (37.6 C) 97.8 F  (36.6 C) 98.4 F (36.9 C)  TempSrc: Axillary Oral Oral Oral  Resp: 18 18 18 18   Height:      Weight:   64.5 kg (142 lb 3.2 oz) 64.5 kg (142 lb 3.2 oz)  SpO2: 95% 94% 90% 95%    Intake/Output Summary (Last 24 hours) at 04/19/13 0957 Last data filed at 04/19/13 0900  Gross per 24 hour  Intake    360 ml  Output    300 ml  Net     60 ml   Filed Weights   04/18/13 0419 04/18/13 2355 04/19/13 0400  Weight: 65.454 kg (144 lb 4.8 oz) 64.5 kg (142 lb 3.2 oz) 64.5 kg (142 lb 3.2 oz)   Exam:  General:  NAD, alert  Cardiovascular: regular rate and rhythm, without MRG  Respiratory: good air movement, clear to auscultation throughout, no wheezing, ronchi or rales  Abdomen: soft, not tender to palpation, positive bowel sounds  MSK: no peripheral edema  Neuro: non focal  Data Reviewed: Basic Metabolic Panel:  Recent Labs Lab 04/14/13 1840 04/15/13 0550 04/18/13 0714  NA 143 141 140  K 4.2 4.0 4.0  CL 106 104 102  CO2 28 25 22   GLUCOSE 98 81 92  BUN 32* 29* 21  CREATININE 1.01 0.88 0.97  CALCIUM 8.8 8.5 8.6   Liver Function Tests:  Recent Labs Lab 04/15/13 0550  AST 11  ALT 5  ALKPHOS 50  BILITOT 0.6  PROT 6.1  ALBUMIN 3.4*   CBC:  Recent Labs Lab 04/14/13 1840 04/15/13 0500 04/18/13 0714  WBC 5.6 4.9 3.6*  NEUTROABS 3.8 3.2  --   HGB  11.5* 11.2* 11.3*  HCT 35.8* 34.3* 33.5*  MCV 92.5 92.0 90.5  PLT 143* 128* 111*   CBG:  Recent Labs Lab 04/18/13 0740 04/18/13 1119 04/18/13 1632 04/18/13 2026 04/19/13 0750  GLUCAP 84 111* 81 105* 83    Recent Results (from the past 240 hour(s))  URINE CULTURE     Status: None   Collection Time    04/14/13  7:45 PM      Result Value Range Status   Specimen Description URINE, RANDOM   Final   Special Requests NONE   Final   Culture  Setup Time     Final   Value: 04/14/2013 21:46     Performed at [place]     Final   Value: 70,000 COLONIES/ML     Performed at [facility]   Culture     Final   Value: PROTEUS MIRABILIS     Performed at [facility]   Report Status 04/17/2013 FINAL   Final   Organism ID, Bacteria PROTEUS MIRABILIS   Final  CLOSTRIDIUM DIFFICILE BY PCR     Status: None   Collection Time    04/15/13  5:22 PM      Result Value Range Status   C difficile by pcr NEGATIVE  NEGATIVE Final     Studies: Dg Chest Port 1 View  04/17/2013   CLINICAL DATA:  Cough  EXAM: PORTABLE CHEST - 1 VIEW  COMPARISON:  04/15/2013  FINDINGS: Normal mediastinum and cardiac silhouette. Normal pulmonary vasculature. No evidence of effusion, infiltrate, or pneumothorax. No acute bony abnormality.  IMPRESSION: No acute cardiopulmonary process.   Electronically Signed   By: Suzy Bouchard M.D.   On: 04/17/2013 14:29    Scheduled Meds: . aspirin  300 mg Rectal Daily   Or  . aspirin  325 mg Oral Daily  . benazepril  10 mg Oral Daily  . carbidopa-levodopa  0.5 tablet Oral QPM  . carbidopa-levodopa  1 tablet Oral BID WC  . cholecalciferol  1,000 Units Oral Daily  . enoxaparin (LOVENOX) injection  40 mg Subcutaneous Daily  . escitalopram  5 mg Oral Q breakfast  . finasteride  5 mg Oral Daily  .  HYDROmorphone (DILAUDID) injection  0.5 mg Intravenous Once  . levofloxacin  500 mg Oral Daily  . LORazepam  0.5 mg Intravenous Once  . vitamin C  500 mg Oral Daily   Continuous Infusions:   Principal Problem:   TIA (transient ischemic attack) Active Problems:   Dysarthria   Acute encephalopathy   Secondary cardiomyopathy, unspecified   Dysphagia, unspecified(787.20)   Weakness generalized   Palliative care encounter  Time spent: [place], MD [facility] Pager [phone]. If 7 PM - 7 AM, please contact night-coverage at [email or web address], password [email or web address] 04/19/2013, 9:57 AM  LOS: 5 days

## 2013-04-19 NOTE — Consult Note (Signed)
I have reviewed and discussed the care of this patient in detail with the nurse practitioner including pertinent patient records, physical exam findings and data. I agree with details of this encounter.  

## 2013-04-20 LAB — GLUCOSE, CAPILLARY
Glucose-Capillary: 110 mg/dL — ABNORMAL HIGH (ref 70–99)
Glucose-Capillary: 92 mg/dL (ref 70–99)
Glucose-Capillary: 102 mg/dL — ABNORMAL HIGH (ref 70–99)
Glucose-Capillary: 111 mg/dL — ABNORMAL HIGH (ref 70–99)

## 2013-04-20 NOTE — Progress Notes (Signed)
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 Cardiology consulted. Conservative management for now.  UTI  - UA not impressive on admission, however urine cultures positive. Given more altered and low grade temp, started Levofloxacin 04/17/13. Afebrile today.  - mental status much improved after antibiotics, remains stable Cough  - continue Levaquin. No pneumonia on CXR.  - influenza panel negative. Parkinson's disease - with progressive decline over the past few months, decline in his mental status here in the hospital.  - palliative medicine consulted TIA / transient slurred speech  - Neurology has been consulted, appreciate assistance, no stroke per MRI, signed off.  - MRI without infarction, MRA without major vessel occlusion.  - 2D echo done, EF 23-55%, diastolic dysfunction grade 2 present. Cards consult as above. - EEG with discharge concerning for an epileptiform nature in the bi-occipital region. Per Neurology, this would not explain his current symptoms and will not start AED now; recommended follow up EEG as an outpatient.  - carotid duplex with no significant stenosis per preliminary read HTN - continue home medications HLD  BPH   Diet: regular Fluids: none DVT Prophylaxis: lovenox  Code Status: Full Family Communication: son at bedside  Disposition Plan: inpatient, SNF Monday  Consultants:  Neurology  Cardiology  Palliative  Procedures:  2D echo   Antibiotics - Levofloxacin  HPI/Subjective: - sleeping, ate breakfast per family  Objective: Filed Vitals:   04/18/13 2355 04/19/13 0400 04/19/13 1334 04/19/13 2100  BP: 138/59 162/73 112/53 162/67  Pulse: 69 54 64 72  Temp: 97.8 F (36.6 C) 98.4 F (36.9 C) 99.2 F (37.3 C) 97.6 F (36.4 C)  TempSrc: Oral Oral Oral   Resp: 18  18 16 20   Height:      Weight: 64.5 kg (142 lb 3.2 oz) 64.5 kg (142 lb 3.2 oz)    SpO2: 90% 95% 93% 99%    Intake/Output Summary (Last 24 hours) at 04/20/13 1228 Last data filed at 04/19/13 2100  Gross per 24 hour  Intake    540 ml  Output    525 ml  Net     15 ml   Filed Weights   04/18/13 0419 04/18/13 2355 04/19/13 0400  Weight: 65.454 kg (144 lb 4.8 oz) 64.5 kg (142 lb 3.2 oz) 64.5 kg (142 lb 3.2 oz)   Exam:  General:  sleeping  Cardiovascular: regular rate and rhythm, without MRG  Respiratory: clear on anterior auscultation  Abdomen: soft, not tender to palpation, positive bowel sounds  MSK: no peripheral edema  Data Reviewed: Basic Metabolic Panel:  Recent Labs Lab 04/14/13 1840 04/15/13 0550 04/18/13 0714  NA 143 141 140  K 4.2 4.0 4.0  CL 106 104 102  CO2 28 25 22   GLUCOSE 98 81 92  BUN 32* 29* 21  CREATININE 1.01 0.88 0.97  CALCIUM 8.8 8.5 8.6   Liver Function Tests:  Recent Labs Lab 04/15/13 0550  AST 11  ALT 5  ALKPHOS 50  BILITOT 0.6  PROT 6.1  ALBUMIN 3.4*   CBC:  Recent Labs Lab 04/14/13 1840 04/15/13 0500 04/18/13 0714  WBC 5.6 4.9 3.6*  NEUTROABS 3.8 3.2  --   HGB 11.5* 11.2* 11.3*  HCT 35.8* 34.3* 33.5*  MCV 92.5 92.0 90.5  PLT 143* 128* 111*   CBG:  Recent Labs Lab 04/19/13 1211  04/19/13 1701 04/19/13 2053 04/20/13 0835 04/20/13 1144  GLUCAP 95 122* 90 110* 92    Recent Results (from the past 240 hour(s))  URINE CULTURE     Status: None   Collection Time    04/14/13  7:45 PM      Result Value Range Status   Specimen Description URINE, RANDOM   Final   Special Requests NONE   Final   Culture  Setup Time     Final   Value: 04/14/2013 21:46     Performed at [facility]     Final   Value: 70,000 COLONIES/ML     Performed at [facility]   Culture     Final   Value: PROTEUS MIRABILIS     Performed at [facility]   Report Status 04/17/2013 FINAL   Final   Organism  ID, Bacteria PROTEUS MIRABILIS   Final  CLOSTRIDIUM DIFFICILE BY PCR     Status: None   Collection Time    04/15/13  5:22 PM      Result Value Range Status   C difficile by pcr NEGATIVE  NEGATIVE Final     Studies: No results found.  Scheduled Meds: . aspirin  300 mg Rectal Daily   Or  . aspirin  325 mg Oral Daily  . benazepril  10 mg Oral Daily  . carbidopa-levodopa  0.5 tablet Oral QPM  . carbidopa-levodopa  1 tablet Oral BID WC  . cholecalciferol  1,000 Units Oral Daily  . enoxaparin (LOVENOX) injection  40 mg Subcutaneous Daily  . escitalopram  5 mg Oral Q breakfast  . finasteride  5 mg Oral Daily  .  HYDROmorphone (DILAUDID) injection  0.5 mg Intravenous Once  . levofloxacin  500 mg Oral Daily  . LORazepam  0.5 mg Intravenous Once  . vitamin C  500 mg Oral Daily   Continuous Infusions:   Principal Problem:   TIA (transient ischemic attack) Active Problems:   Dysarthria   Acute encephalopathy   Secondary cardiomyopathy, unspecified   Dysphagia, unspecified(787.20)   Weakness generalized   Palliative care encounter  Time spent: [place], MD [facility] Pager [phone]. If 7 PM - 7 AM, please contact night-coverage at [email or web address], password [facility] 04/20/2013, 12:28 PM  LOS: 6 days

## 2013-04-21 LAB — GLUCOSE, CAPILLARY
Glucose-Capillary: 96 mg/dL (ref 70–99)
Glucose-Capillary: 94 mg/dL (ref 70–99)

## 2013-04-21 NOTE — Discharge Instructions (Signed)
STROKE/TIA DISCHARGE INSTRUCTIONS SMOKING Cigarette smoking nearly doubles your risk of having a stroke & is the single most alterable risk factor  If you smoke or have smoked in the last 12 months, you are advised to quit smoking for your health.  Most of the excess cardiovascular risk related to smoking disappears within a year of stopping.  Ask you doctor about anti-smoking medications  [place] Quit Line: 1-800-QUIT NOW  Free Smoking Cessation Classes [phone]  CHOLESTEROL Know your levels; limit fat & cholesterol in your diet  Lipid Panel     Component Value Date/Time   CHOL 152 04/15/2013 0550   TRIG 53 04/15/2013 0550   HDL 57 04/15/2013 0550   CHOLHDL 2.7 04/15/2013 0550   VLDL 11 04/15/2013 0550   LDLCALC 84 04/15/2013 0550      Many patients benefit from treatment even if their cholesterol is at goal.  Goal: Total Cholesterol (CHOL) less than 160  Goal:  Triglycerides (TRIG) less than 150  Goal:  HDL greater than 40  Goal:  LDL (LDLCALC) less than 100   BLOOD PRESSURE American Stroke Association blood pressure target is less that 120/80 mm/Hg  Your discharge blood pressure is:  BP: 155/58 mmHg  Monitor your blood pressure  Limit your salt and alcohol intake  Many individuals will require more than one medication for high blood pressure  DIABETES (A1c is a blood sugar average for last 3 months) Goal HGBA1c is under 7% (HBGA1c is blood sugar average for last 3 months)  Diabetes: No known diagnosis of diabetes    Lab Results  Component Value Date   HGBA1C 5.5 04/15/2013     Your HGBA1c can be lowered with medications, healthy diet, and exercise.  Check your blood sugar as directed by your physician  Call your physician if you experience unexplained or low blood sugars.  PHYSICAL ACTIVITY/REHABILITATION Goal is 30 minutes at least 4 days per week  Activity: up with assist Therapies: per SNF Return to work:   Activity decreases your risk of heart attack  and stroke and makes your heart stronger.  It helps control your weight and blood pressure; helps you relax and can improve your mood.  Participate in a regular exercise program.  Talk with your doctor about the best form of exercise for you (dancing, walking, swimming, cycling).  DIET/WEIGHT Goal is to maintain a healthy weight  Your discharge diet is: General   Your height is:  Height: 5\' 11"  (180.3 cm) Your current weight is: Weight: 64.5 kg (142 lb 3.2 oz) Your Body Mass Index (BMI) is:  BMI (Calculated): 19.7  Following the type of diet specifically designed for you will help prevent another stroke.  Your goal weight range is:    Your goal Body Mass Index (BMI) is 19-24.  Healthy food habits can help reduce 3 risk factors for stroke:  High cholesterol, hypertension, and excess weight.  RESOURCES Stroke/Support Group:  Call [phone]   STROKE EDUCATION PROVIDED/REVIEWED AND GIVEN TO PATIENT Stroke warning signs and symptoms How to activate emergency medical system (call 911). Medications prescribed at discharge. Need for follow-up after discharge. Personal risk factors for stroke. Pneumonia vaccine given: No Flu vaccine given: No My questions have been answered, the writing is legible, and I understand these instructions.  I will adhere to these goals & educational materials that have been provided to me after my discharge from the hospital.

## 2013-04-22 NOTE — Progress Notes (Signed)
Clinical social worker assisted with patient discharge to skilled nursing facility, [place].  CSW addressed all family questions and concerns. CSW copied chart and added all important documents. CSW also set up patient transportation with [employer]omatic [employer] operational officer. Clinical Social Worker will sign off for now as social work intervention is no longer needed.   Rhea Pink, MSW, [place]

## 2013-04-25 LAB — CULTURE, BLOOD (ROUTINE X 2)
CULTURE: NO GROWTH
CULTURE: NO GROWTH

## 2013-08-14 ENCOUNTER — Ambulatory Visit: Payer: Medicare Other | Admitting: Neurology

## 2013-09-16 NOTE — Consult Note (Signed)
NAME:  EULIS, SALAZAR                  ACCOUNT NO.:  MEDICAL RECORD NO.:  [id number]  LOCATION:                                 FACILITY:  PHYSICIAN:  Leonides Sake. Lucia Gaskins, M.D.DATE OF BIRTH:  1922/02/29  DATE OF CONSULTATION:  09/11/2013 DATE OF DISCHARGE:                                CONSULTATION   REASON FOR CONSULT:  I was consulted concerning a trach change for patient in [facility].  The patient was initially admitted and had a tracheostomy performed at [facility] because of acute respiratory failure and cephalopathy.  He is morbidly obese and had a #7 cuffed proximal XLT trach placed about a month ago. He was subsequently transferred to [facility] for long- term pulmonary care.  He was on the ventilator initially and subsequently been off the ventilator.  He has apparently had a previous tracheostomy in the past and it was recommended by the ENT surgeon performing the trach at [facility] for the patient to keep this trach a long term.  He is presently using the Passy-Muir valve with a #7 cuffed proximal XLT trach in place.  The cuffed #7 XLT trach was removed and a proximal XLT #6 cuffless trach was inserted without any difficulty.  He had good respiratory function as well as good speech with the trach cord.  This completed the trach change.  Patient tolerated this well, with no real difficulties.          ______________________________ Leonides Sake Lucia Gaskins, M.D.     CEN/MEDQ  D:  09/12/2013  T:  09/13/2013  Job:  [id number]

## 2014-03-17 DEATH — deceased
